# Patient Record
Sex: Male | Born: 1980
Health system: Southern US, Community
[De-identification: ages and names within clinical notes are randomized; demographics above are authoritative.]

## PROBLEM LIST (undated history)

## (undated) DIAGNOSIS — K219 Gastro-esophageal reflux disease without esophagitis: Secondary | ICD-10-CM

## (undated) DIAGNOSIS — J45909 Unspecified asthma, uncomplicated: Secondary | ICD-10-CM

## (undated) DIAGNOSIS — N200 Calculus of kidney: Secondary | ICD-10-CM

## (undated) HISTORY — PX: WISDOM TOOTH EXTRACTION: SHX21

---

## 2004-12-29 ENCOUNTER — Emergency Department (HOSPITAL_COMMUNITY): Admission: EM | Admit: 2004-12-29 | Discharge: 2004-12-29 | Payer: Self-pay | Admitting: Emergency Medicine

## 2005-11-10 ENCOUNTER — Emergency Department (HOSPITAL_COMMUNITY): Admission: EM | Admit: 2005-11-10 | Discharge: 2005-11-10 | Payer: Self-pay | Admitting: Emergency Medicine

## 2006-06-27 ENCOUNTER — Emergency Department (HOSPITAL_COMMUNITY): Admission: EM | Admit: 2006-06-27 | Discharge: 2006-06-27 | Payer: Self-pay | Admitting: Emergency Medicine

## 2006-11-03 ENCOUNTER — Ambulatory Visit: Payer: Self-pay | Admitting: *Deleted

## 2006-11-03 ENCOUNTER — Ambulatory Visit: Payer: Self-pay | Admitting: Internal Medicine

## 2006-11-07 ENCOUNTER — Emergency Department (HOSPITAL_COMMUNITY): Admission: EM | Admit: 2006-11-07 | Discharge: 2006-11-07 | Payer: Self-pay | Admitting: Family Medicine

## 2006-11-08 ENCOUNTER — Emergency Department (HOSPITAL_COMMUNITY): Admission: EM | Admit: 2006-11-08 | Discharge: 2006-11-08 | Payer: Self-pay | Admitting: Emergency Medicine

## 2006-11-09 ENCOUNTER — Ambulatory Visit (HOSPITAL_COMMUNITY): Admission: RE | Admit: 2006-11-09 | Discharge: 2006-11-09 | Payer: Self-pay | Admitting: Family Medicine

## 2006-11-11 ENCOUNTER — Ambulatory Visit: Payer: Self-pay | Admitting: Internal Medicine

## 2006-11-27 ENCOUNTER — Emergency Department (HOSPITAL_COMMUNITY): Admission: EM | Admit: 2006-11-27 | Discharge: 2006-11-27 | Payer: Self-pay | Admitting: Emergency Medicine

## 2007-01-08 ENCOUNTER — Emergency Department (HOSPITAL_COMMUNITY): Admission: EM | Admit: 2007-01-08 | Discharge: 2007-01-08 | Payer: Self-pay | Admitting: Family Medicine

## 2007-02-07 ENCOUNTER — Encounter (INDEPENDENT_AMBULATORY_CARE_PROVIDER_SITE_OTHER): Payer: Self-pay | Admitting: Nurse Practitioner

## 2007-02-07 ENCOUNTER — Ambulatory Visit: Payer: Self-pay | Admitting: Internal Medicine

## 2007-02-07 LAB — CONVERTED CEMR LAB
ALT: 12 units/L (ref 0–53)
AST: 16 units/L (ref 0–37)
Albumin: 4.7 g/dL (ref 3.5–5.2)
Alkaline Phosphatase: 44 units/L (ref 39–117)
BUN: 12 mg/dL (ref 6–23)
Basophils Absolute: 0 10*3/uL (ref 0.0–0.1)
Basophils Relative: 0 % (ref 0–1)
CO2: 24 meq/L (ref 19–32)
Calcium: 9.5 mg/dL (ref 8.4–10.5)
Chloride: 108 meq/L (ref 96–112)
Creatinine, Ser: 0.85 mg/dL (ref 0.40–1.50)
Eosinophils Absolute: 0 10*3/uL (ref 0.0–0.7)
Eosinophils Relative: 0 % (ref 0–5)
Glucose, Bld: 96 mg/dL (ref 70–99)
HCT: 40.2 % (ref 39.0–52.0)
Hemoglobin: 13.6 g/dL (ref 13.0–17.0)
Lymphocytes Relative: 34 % (ref 12–46)
Lymphs Abs: 1.7 10*3/uL (ref 0.7–4.0)
MCHC: 33.8 g/dL (ref 30.0–36.0)
MCV: 95.3 fL (ref 78.0–100.0)
Monocytes Absolute: 0.6 10*3/uL (ref 0.1–1.0)
Monocytes Relative: 12 % (ref 3–12)
Neutro Abs: 2.6 10*3/uL (ref 1.7–7.7)
Neutrophils Relative %: 54 % (ref 43–77)
Platelets: 257 10*3/uL (ref 150–400)
Potassium: 4.2 meq/L (ref 3.5–5.3)
RBC: 4.22 M/uL (ref 4.22–5.81)
RDW: 12.9 % (ref 11.5–15.5)
Sodium: 142 meq/L (ref 135–145)
Total Bilirubin: 0.7 mg/dL (ref 0.3–1.2)
Total Protein: 7.1 g/dL (ref 6.0–8.3)
WBC: 4.9 10*3/uL (ref 4.0–10.5)

## 2007-05-15 ENCOUNTER — Emergency Department (HOSPITAL_COMMUNITY): Admission: EM | Admit: 2007-05-15 | Discharge: 2007-05-15 | Payer: Self-pay | Admitting: Emergency Medicine

## 2007-05-15 ENCOUNTER — Ambulatory Visit: Payer: Self-pay | Admitting: Internal Medicine

## 2007-06-08 ENCOUNTER — Ambulatory Visit: Payer: Self-pay | Admitting: Internal Medicine

## 2008-02-03 ENCOUNTER — Emergency Department (HOSPITAL_COMMUNITY): Admission: EM | Admit: 2008-02-03 | Discharge: 2008-02-03 | Payer: Self-pay | Admitting: Family Medicine

## 2008-07-17 ENCOUNTER — Emergency Department (HOSPITAL_COMMUNITY): Admission: EM | Admit: 2008-07-17 | Discharge: 2008-07-17 | Payer: Self-pay | Admitting: Family Medicine

## 2010-02-26 IMAGING — CR DG HAND COMPLETE 3+V*L*
3 series · 3 of 3 positions shown · non-contrast
Comparison: None

CLINICAL DATA: Hand trauma.  Pain and swelling.

LEFT HAND - COMPLETE 3+ VIEW

[view not recorded (1 of 3)]
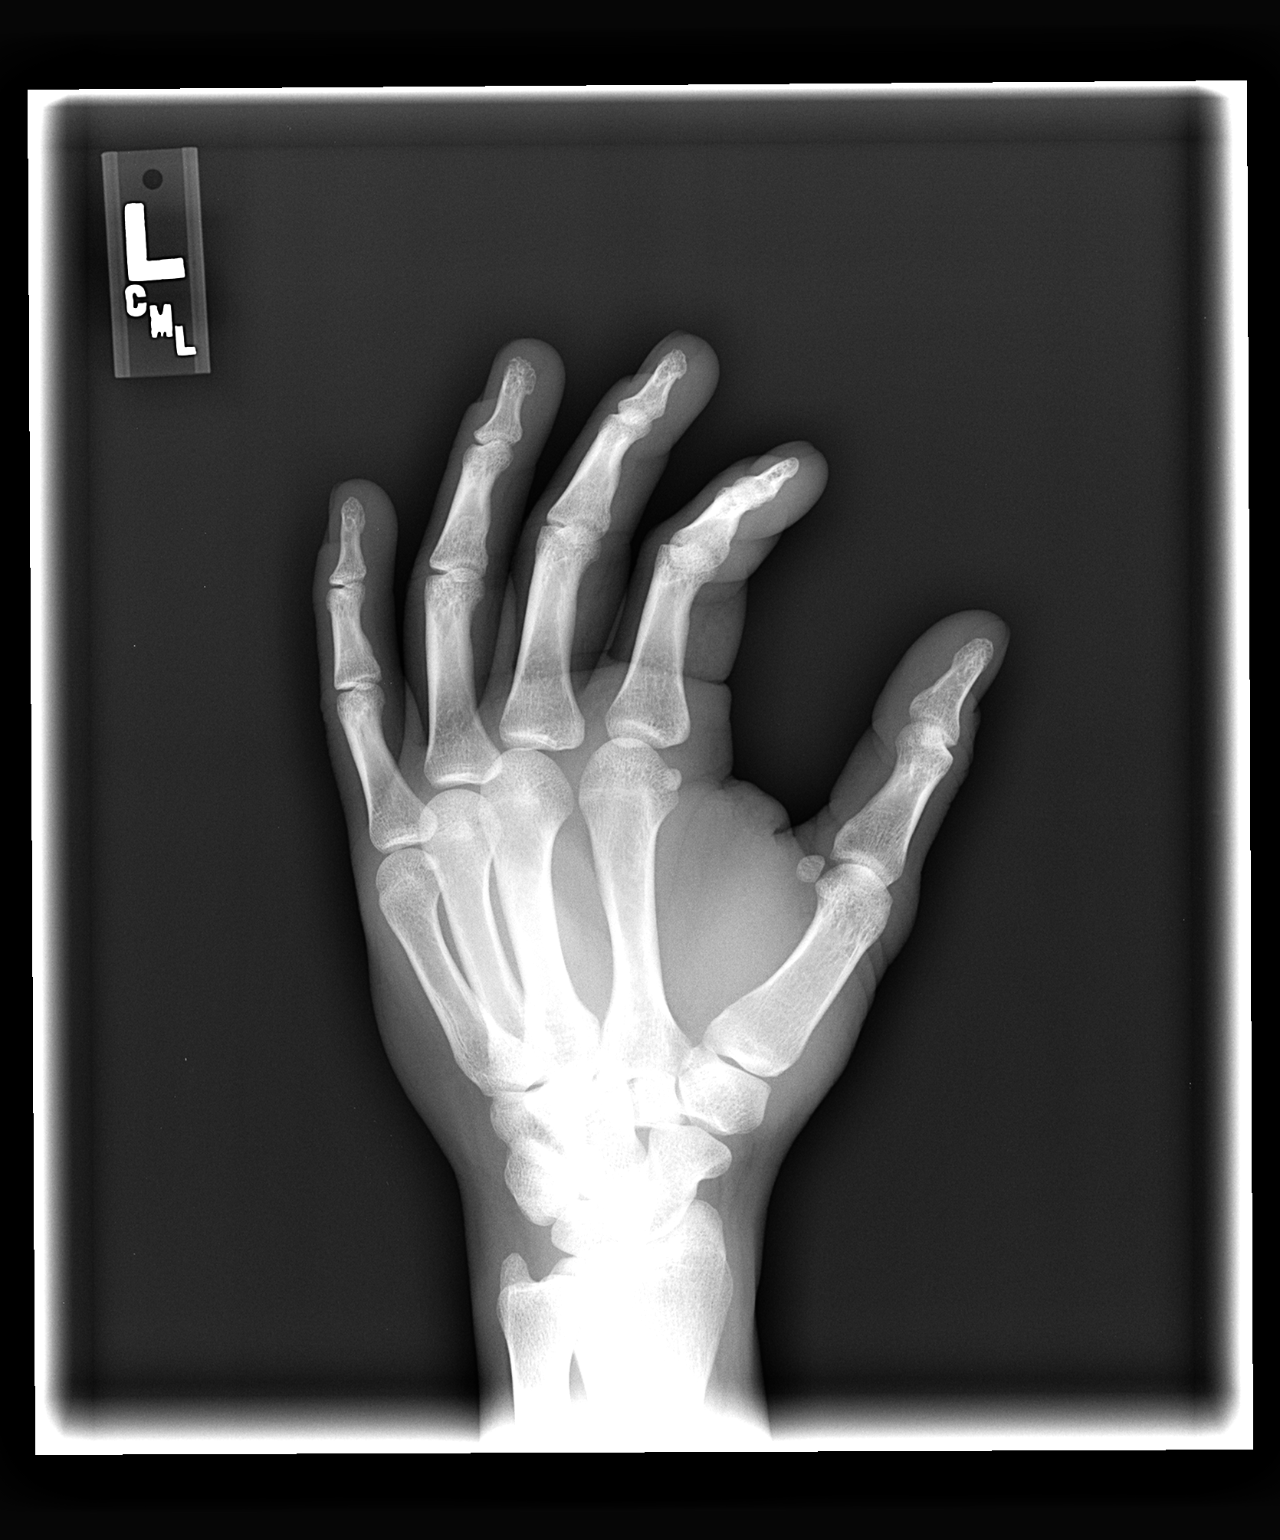

[view not recorded (2 of 3)]
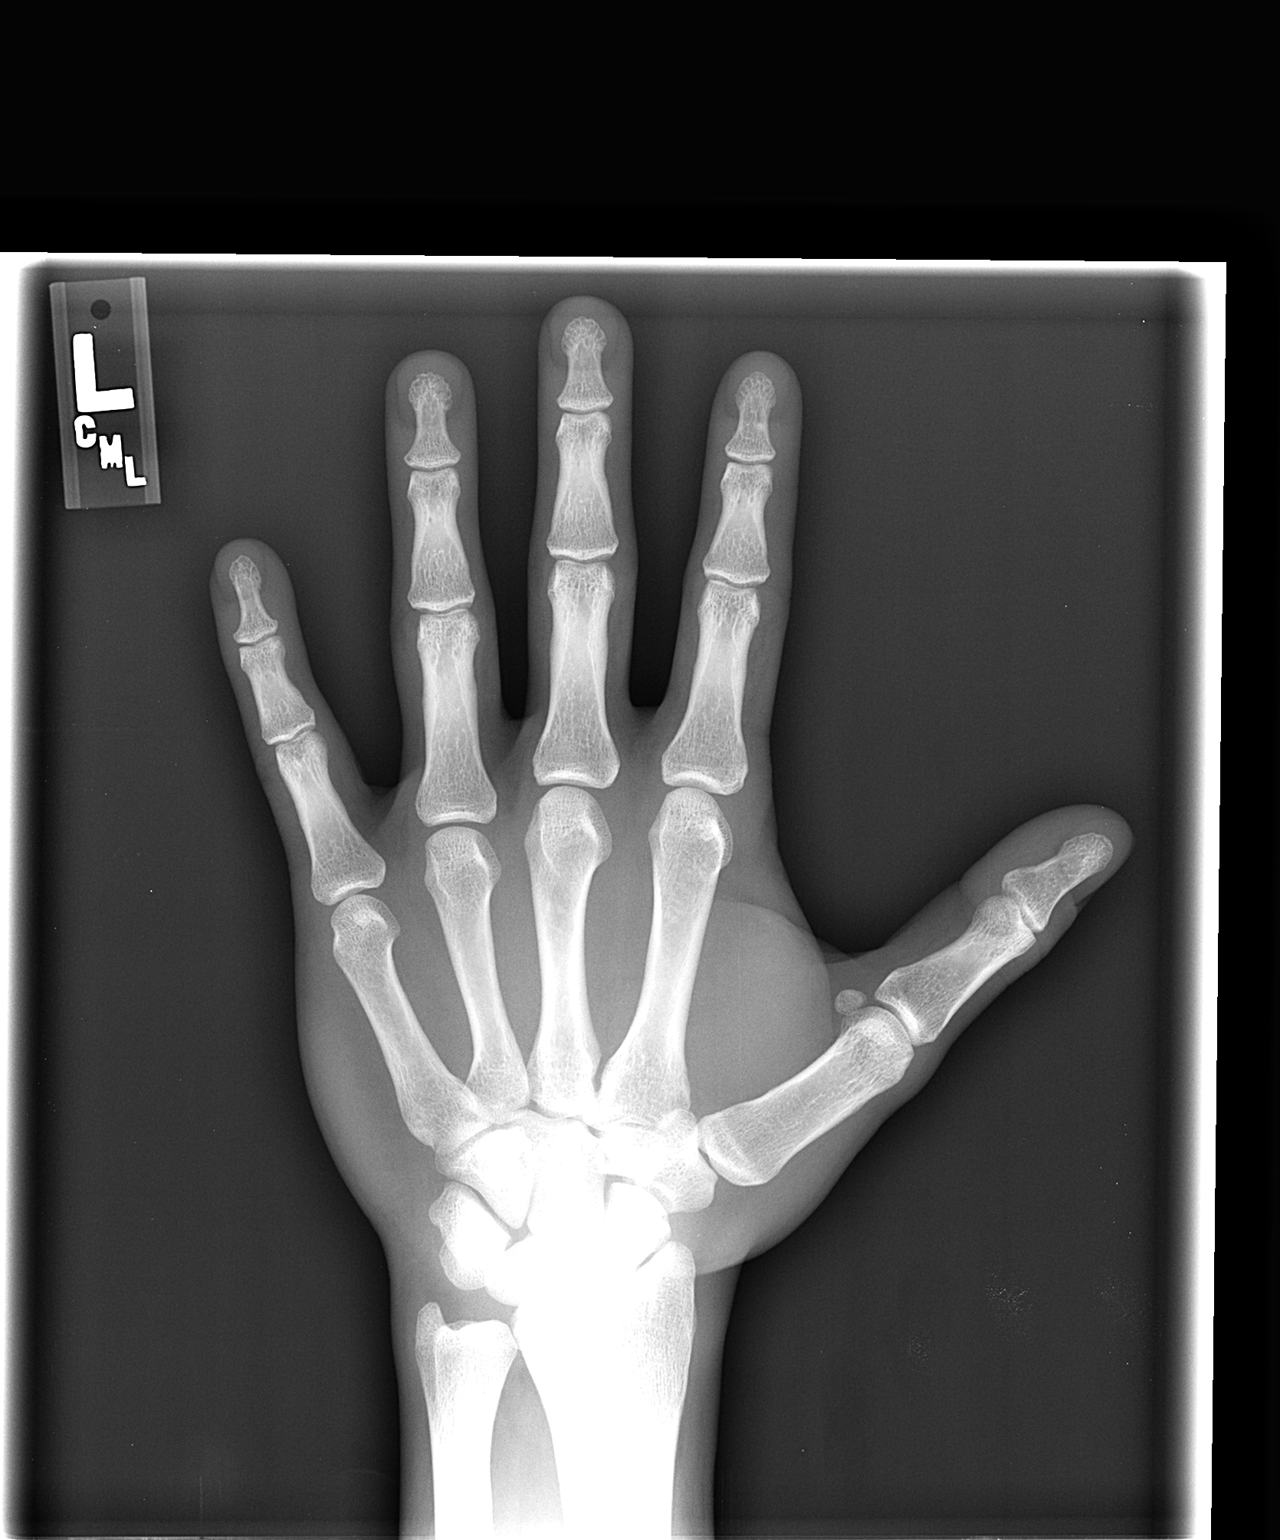

[view not recorded (3 of 3)]
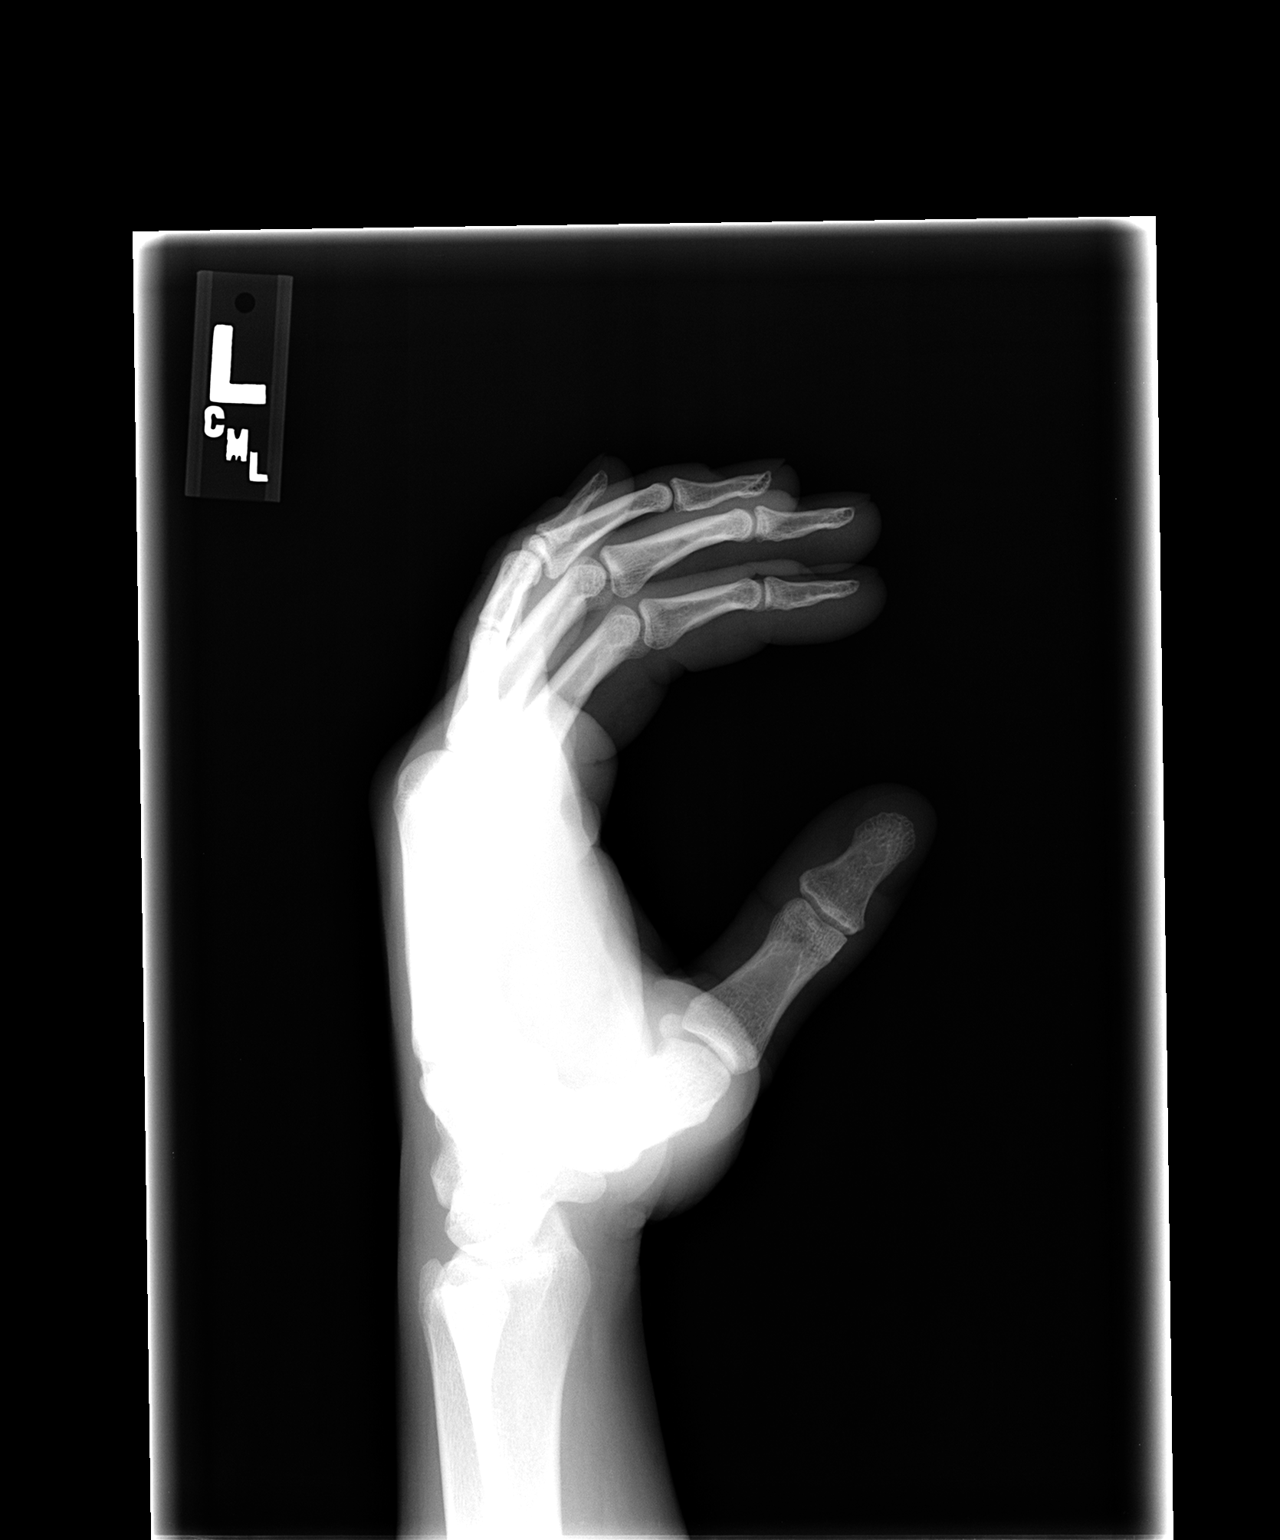

[3 of 3 positions shown; findings below may reference images not displayed]

FINDINGS: There is no evidence of fracture or dislocation.  There
is no evidence of arthropathy or other focal bone abnormality.
Soft tissues are unremarkable.
IMPRESSION: Negative.

## 2010-11-19 LAB — CULTURE, ROUTINE-ABSCESS

## 2014-11-12 ENCOUNTER — Emergency Department (HOSPITAL_COMMUNITY): Payer: Self-pay

## 2014-11-12 ENCOUNTER — Inpatient Hospital Stay (HOSPITAL_COMMUNITY)
Admission: EM | Admit: 2014-11-12 | Discharge: 2014-11-14 | DRG: 563 | Disposition: A | Discharge status: Home / Self Care | Payer: Self-pay | Attending: Orthopedic Surgery | Admitting: Orthopedic Surgery

## 2014-11-12 ENCOUNTER — Encounter (HOSPITAL_COMMUNITY): Payer: Self-pay | Admitting: Emergency Medicine

## 2014-11-12 DIAGNOSIS — K219 Gastro-esophageal reflux disease without esophagitis: Secondary | ICD-10-CM | POA: Diagnosis present

## 2014-11-12 DIAGNOSIS — J45909 Unspecified asthma, uncomplicated: Secondary | ICD-10-CM | POA: Diagnosis present

## 2014-11-12 DIAGNOSIS — S82841B Displaced bimalleolar fracture of right lower leg, initial encounter for open fracture type I or II: Principal | ICD-10-CM | POA: Diagnosis present

## 2014-11-12 DIAGNOSIS — S82899B Other fracture of unspecified lower leg, initial encounter for open fracture type I or II: Secondary | ICD-10-CM | POA: Diagnosis present

## 2014-11-12 DIAGNOSIS — S82891C Other fracture of right lower leg, initial encounter for open fracture type IIIA, IIIB, or IIIC: Secondary | ICD-10-CM

## 2014-11-12 DIAGNOSIS — Z91048 Other nonmedicinal substance allergy status: Secondary | ICD-10-CM

## 2014-11-12 HISTORY — DX: Unspecified asthma, uncomplicated: J45.909

## 2014-11-12 LAB — CBC WITH DIFFERENTIAL/PLATELET
Basophils Absolute: 0 10*3/uL (ref 0.0–0.1)
Basophils Relative: 1 %
EOS ABS: 0.1 10*3/uL (ref 0.0–0.7)
EOS PCT: 2 %
HCT: 37.9 % — ABNORMAL LOW (ref 39.0–52.0)
Hemoglobin: 12.5 g/dL — ABNORMAL LOW (ref 13.0–17.0)
LYMPHS ABS: 2.9 10*3/uL (ref 0.7–4.0)
LYMPHS PCT: 49 %
MCH: 31.4 pg (ref 26.0–34.0)
MCHC: 33 g/dL (ref 30.0–36.0)
MCV: 95.2 fL (ref 78.0–100.0)
MONO ABS: 0.7 10*3/uL (ref 0.1–1.0)
Monocytes Relative: 11 %
Neutro Abs: 2.1 10*3/uL (ref 1.7–7.7)
Neutrophils Relative %: 37 %
PLATELETS: 233 10*3/uL (ref 150–400)
RBC: 3.98 MIL/uL — ABNORMAL LOW (ref 4.22–5.81)
RDW: 12.2 % (ref 11.5–15.5)
WBC: 5.8 10*3/uL (ref 4.0–10.5)

## 2014-11-12 LAB — BASIC METABOLIC PANEL
Anion gap: 7 (ref 5–15)
BUN: 12 mg/dL (ref 6–20)
CALCIUM: 8.9 mg/dL (ref 8.9–10.3)
CO2: 27 mmol/L (ref 22–32)
CREATININE: 0.81 mg/dL (ref 0.61–1.24)
Chloride: 105 mmol/L (ref 101–111)
GFR calc Af Amer: >60 mL/min (ref >60)
GLUCOSE: 95 mg/dL (ref 65–99)
Potassium: 3.4 mmol/L — ABNORMAL LOW (ref 3.5–5.1)
SODIUM: 139 mmol/L (ref 135–145)

## 2014-11-12 MED ORDER — TETANUS-DIPHTH-ACELL PERTUSSIS 5-2.5-18.5 LF-MCG/0.5 IM SUSP
0.5000 mL | Freq: Once | INTRAMUSCULAR | Status: AC
  Administered 2014-11-12: 0.5 mL via INTRAMUSCULAR

## 2014-11-12 MED ORDER — CEFAZOLIN SODIUM 1-5 GM-% IV SOLN: 1.0000 g | Freq: Once | INTRAVENOUS | Status: DC

## 2014-11-12 MED ORDER — HYDROMORPHONE HCL 1 MG/ML IJ SOLN: 1.0000 mg | INTRAMUSCULAR | Status: DC | PRN

## 2014-11-12 MED ORDER — CEFAZOLIN SODIUM-DEXTROSE 2-3 GM-% IV SOLR
INTRAVENOUS | Status: AC
  Administered 2014-11-12: 2000 mg
  Filled 2014-11-12: qty 50

## 2014-11-12 MED ORDER — HYDROMORPHONE HCL 1 MG/ML IJ SOLN
INTRAMUSCULAR | Status: AC
  Administered 2014-11-12: 1 mg
  Filled 2014-11-12: qty 1

## 2014-11-12 MED ORDER — LIDOCAINE-EPINEPHRINE 2 %-1:200000 IJ SOLN
20.0000 mL | Freq: Once | INTRAMUSCULAR | Status: AC
Start: 2014-11-12 — End: 2014-11-12
  Administered 2014-11-12: 20 mL via INTRADERMAL

## 2014-11-12 MED ORDER — TETANUS-DIPHTH-ACELL PERTUSSIS 5-2.5-18.5 LF-MCG/0.5 IM SUSP
INTRAMUSCULAR | Status: AC
  Filled 2014-11-12: qty 0.5

## 2014-11-12 MED ORDER — LIDOCAINE-EPINEPHRINE (PF) 2 %-1:200000 IJ SOLN
INTRAMUSCULAR | Status: AC
  Filled 2014-11-12: qty 20

## 2014-11-12 NOTE — ED Notes (Signed)
Awaiting Dr. Shon Baton surgeon to see the patient.
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
## 2014-11-12 NOTE — ED Notes (Signed)
Dr. Clydene Pugh at the bedsde,
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
## 2014-11-12 NOTE — Progress Notes (Signed)
Orthopedic Tech Progress Note Patient Details:  John Lozano [facility] 23-Feb-1980 [id number]  Ortho Devices Type of Ortho Device: Ace wrap, Post (short leg) splint, Stirrup splint Ortho Device/Splint Location: RLE Ortho Device/Splint Interventions: Ordered, Application   Jennye Moccasin 11/12/2014, 11:05 PM

## 2014-11-13 ENCOUNTER — Emergency Department (HOSPITAL_COMMUNITY): Payer: No Typology Code available for payment source | Admitting: Anesthesiology

## 2014-11-13 ENCOUNTER — Inpatient Hospital Stay (HOSPITAL_COMMUNITY): Payer: Self-pay

## 2014-11-13 ENCOUNTER — Encounter (HOSPITAL_COMMUNITY)
Admission: EM | Disposition: A | Discharge status: Home / Self Care | Payer: Self-pay | Source: Home / Self Care | Attending: Orthopedic Surgery

## 2014-11-13 ENCOUNTER — Emergency Department (HOSPITAL_COMMUNITY): Payer: Self-pay | Admitting: Anesthesiology

## 2014-11-13 DIAGNOSIS — S82899B Other fracture of unspecified lower leg, initial encounter for open fracture type I or II: Secondary | ICD-10-CM | POA: Diagnosis present

## 2014-11-13 HISTORY — PX: I & D EXTREMITY: SHX5045

## 2014-11-13 HISTORY — DX: Other fracture of unspecified lower leg, initial encounter for open fracture type I or II: S82.899B

## 2014-11-13 SURGERY — MINOR IRRIGATION AND DEBRIDEMENT EXTREMITY
Anesthesia: General | Site: Ankle | Laterality: Right

## 2014-11-13 MED ORDER — METOCLOPRAMIDE HCL 5 MG PO TABS: 5.0000 mg | ORAL_TABLET | Freq: Three times a day (TID) | ORAL | Status: DC | PRN

## 2014-11-13 MED ORDER — 0.9 % SODIUM CHLORIDE (POUR BTL) OPTIME
TOPICAL | Status: DC | PRN
  Administered 2014-11-13: 1000 mL

## 2014-11-13 MED ORDER — CEFAZOLIN SODIUM-DEXTROSE 2-3 GM-% IV SOLR
INTRAVENOUS | Status: DC | PRN
  Administered 2014-11-13: 2 g via INTRAVENOUS

## 2014-11-13 MED ORDER — LACTATED RINGERS IV SOLN
INTRAVENOUS | Status: DC | PRN
  Administered 2014-11-13: 01:00:00 via INTRAVENOUS

## 2014-11-13 MED ORDER — MIDAZOLAM HCL 2 MG/2ML IJ SOLN
INTRAMUSCULAR | Status: DC | PRN
  Administered 2014-11-13: 2 mg via INTRAVENOUS

## 2014-11-13 MED ORDER — CEFAZOLIN SODIUM 1-5 GM-% IV SOLN
INTRAVENOUS | Status: AC
  Filled 2014-11-13: qty 50

## 2014-11-13 MED ORDER — HYDROMORPHONE HCL 1 MG/ML IJ SOLN
0.2500 mg | INTRAMUSCULAR | Status: DC | PRN
  Administered 2014-11-13 (×4): 0.5 mg via INTRAVENOUS

## 2014-11-13 MED ORDER — MIDAZOLAM HCL 2 MG/2ML IJ SOLN
0.5000 mg | Freq: Once | INTRAMUSCULAR | Status: AC | PRN
  Administered 2014-11-13: 2 mg via INTRAVENOUS

## 2014-11-13 MED ORDER — MIDAZOLAM HCL 2 MG/2ML IJ SOLN
INTRAMUSCULAR | Status: AC
  Filled 2014-11-13: qty 2

## 2014-11-13 MED ORDER — SUCCINYLCHOLINE CHLORIDE 20 MG/ML IJ SOLN
INTRAMUSCULAR | Status: DC | PRN
  Administered 2014-11-13: 100 mg via INTRAVENOUS

## 2014-11-13 MED ORDER — OXYCODONE-ACETAMINOPHEN 5-325 MG PO TABS
1.0000 | ORAL_TABLET | ORAL | Status: DC | PRN
  Administered 2014-11-13 – 2014-11-14 (×6): 2 via ORAL
  Filled 2014-11-13 (×6): qty 2

## 2014-11-13 MED ORDER — KETOROLAC TROMETHAMINE 15 MG/ML IJ SOLN
15.0000 mg | Freq: Four times a day (QID) | INTRAMUSCULAR | Status: AC
  Administered 2014-11-13 (×4): 15 mg via INTRAVENOUS
  Filled 2014-11-13 (×3): qty 1

## 2014-11-13 MED ORDER — HYDROMORPHONE HCL 1 MG/ML IJ SOLN
INTRAMUSCULAR | Status: AC
  Filled 2014-11-13: qty 1

## 2014-11-13 MED ORDER — ACETAMINOPHEN 325 MG PO TABS: 650.0000 mg | ORAL_TABLET | Freq: Four times a day (QID) | ORAL | Status: DC | PRN

## 2014-11-13 MED ORDER — KETOROLAC TROMETHAMINE 15 MG/ML IJ SOLN
INTRAMUSCULAR | Status: AC
  Filled 2014-11-13: qty 1

## 2014-11-13 MED ORDER — SUCCINYLCHOLINE CHLORIDE 20 MG/ML IJ SOLN
INTRAMUSCULAR | Status: AC
  Filled 2014-11-13: qty 1

## 2014-11-13 MED ORDER — PROPOFOL 10 MG/ML IV BOLUS
INTRAVENOUS | Status: DC | PRN
  Administered 2014-11-13: 200 mg via INTRAVENOUS

## 2014-11-13 MED ORDER — LACTATED RINGERS IV SOLN
INTRAVENOUS | Status: DC
  Administered 2014-11-13: 05:00:00 via INTRAVENOUS

## 2014-11-13 MED ORDER — FENTANYL CITRATE (PF) 250 MCG/5ML IJ SOLN
INTRAMUSCULAR | Status: AC
  Filled 2014-11-13: qty 5

## 2014-11-13 MED ORDER — FENTANYL CITRATE (PF) 250 MCG/5ML IJ SOLN
INTRAMUSCULAR | Status: DC | PRN
  Administered 2014-11-13 (×4): 50 ug via INTRAVENOUS

## 2014-11-13 MED ORDER — CEFAZOLIN SODIUM 1-5 GM-% IV SOLN: 1.0000 g | Freq: Once | INTRAVENOUS | Status: DC

## 2014-11-13 MED ORDER — LIDOCAINE HCL (CARDIAC) 20 MG/ML IV SOLN
INTRAVENOUS | Status: DC | PRN
  Administered 2014-11-13: 60 mg via INTRAVENOUS

## 2014-11-13 MED ORDER — PROPOFOL 10 MG/ML IV BOLUS
INTRAVENOUS | Status: AC
  Filled 2014-11-13: qty 20

## 2014-11-13 MED ORDER — METHOCARBAMOL 1000 MG/10ML IJ SOLN
500.0000 mg | Freq: Four times a day (QID) | INTRAVENOUS | Status: DC | PRN
  Filled 2014-11-13: qty 5

## 2014-11-13 MED ORDER — METOCLOPRAMIDE HCL 5 MG/ML IJ SOLN: 5.0000 mg | Freq: Three times a day (TID) | INTRAMUSCULAR | Status: DC | PRN

## 2014-11-13 MED ORDER — ONDANSETRON HCL 4 MG/2ML IJ SOLN
INTRAMUSCULAR | Status: DC | PRN
  Administered 2014-11-13: 4 mg via INTRAVENOUS

## 2014-11-13 MED ORDER — ONDANSETRON HCL 4 MG/2ML IJ SOLN: 4.0000 mg | Freq: Four times a day (QID) | INTRAMUSCULAR | Status: DC | PRN

## 2014-11-13 MED ORDER — OXYCODONE HCL 5 MG PO TABS: 5.0000 mg | ORAL_TABLET | ORAL | Status: DC | PRN

## 2014-11-13 MED ORDER — ACETAMINOPHEN 650 MG RE SUPP: 650.0000 mg | Freq: Four times a day (QID) | RECTAL | Status: DC | PRN

## 2014-11-13 MED ORDER — LIDOCAINE HCL (CARDIAC) 20 MG/ML IV SOLN
INTRAVENOUS | Status: AC
  Filled 2014-11-13: qty 10

## 2014-11-13 MED ORDER — ONDANSETRON HCL 4 MG PO TABS: 4.0000 mg | ORAL_TABLET | Freq: Four times a day (QID) | ORAL | Status: DC | PRN

## 2014-11-13 MED ORDER — METHOCARBAMOL 500 MG PO TABS
ORAL_TABLET | ORAL | Status: AC
Start: 2014-11-13 — End: 2014-11-13
  Filled 2014-11-13: qty 1

## 2014-11-13 MED ORDER — MORPHINE SULFATE (PF) 2 MG/ML IV SOLN
2.0000 mg | INTRAVENOUS | Status: DC | PRN
  Administered 2014-11-13: 2 mg via INTRAVENOUS
  Filled 2014-11-13: qty 1

## 2014-11-13 MED ORDER — METHOCARBAMOL 500 MG PO TABS
500.0000 mg | ORAL_TABLET | Freq: Four times a day (QID) | ORAL | Status: DC | PRN
Start: 2014-11-13 — End: 2014-11-14
  Administered 2014-11-13 – 2014-11-14 (×6): 500 mg via ORAL
  Filled 2014-11-13 (×7): qty 1

## 2014-11-13 MED ORDER — CEFAZOLIN SODIUM 1-5 GM-% IV SOLN
1.0000 g | Freq: Four times a day (QID) | INTRAVENOUS | Status: AC
  Administered 2014-11-13 (×3): 1 g via INTRAVENOUS
  Filled 2014-11-13 (×4): qty 50

## 2014-11-13 MED ORDER — MIDAZOLAM HCL 2 MG/2ML IJ SOLN
INTRAMUSCULAR | Status: AC
  Filled 2014-11-13: qty 4

## 2014-11-13 SURGICAL SUPPLY — 75 items
BANDAGE ELASTIC 4 VELCRO ST LF (GAUZE/BANDAGES/DRESSINGS) ×1 IMPLANT
BANDAGE ELASTIC 6 VELCRO ST LF (GAUZE/BANDAGES/DRESSINGS) ×1 IMPLANT
BNDG GAUZE ELAST 4 BULKY (GAUZE/BANDAGES/DRESSINGS) ×1 IMPLANT
BUR EGG ELITE 4.0 (BURR) IMPLANT
BUR MATCHSTICK NEURO 3.0 LAGG (BURR) ×1 IMPLANT
CANISTER SUCTION 2500CC (MISCELLANEOUS) ×2 IMPLANT
CLSR STERI-STRIP ANTIMIC 1/2X4 (GAUZE/BANDAGES/DRESSINGS) ×2 IMPLANT
CORDS BIPOLAR (ELECTRODE) ×2 IMPLANT
COVER SURGICAL LIGHT HANDLE (MISCELLANEOUS) ×2 IMPLANT
DRAIN CHANNEL 15F RND FF W/TCR (WOUND CARE) ×1 IMPLANT
DRAPE INCISE IOBAN 66X45 STRL (DRAPES) ×1 IMPLANT
DRAPE POUCH INSTRU U-SHP 10X18 (DRAPES) ×2 IMPLANT
DRAPE SURG 17X23 STRL (DRAPES) ×6 IMPLANT
DRAPE U-SHAPE 47X51 STRL (DRAPES) ×1 IMPLANT
DRSG MEPILEX BORDER 4X8 (GAUZE/BANDAGES/DRESSINGS) ×2 IMPLANT
DRSG PAD ABDOMINAL 8X10 ST (GAUZE/BANDAGES/DRESSINGS) ×2 IMPLANT
DURAPREP 26ML APPLICATOR (WOUND CARE) ×1 IMPLANT
ELECT BLADE 4.0 EZ CLEAN MEGAD (MISCELLANEOUS)
ELECT CAUTERY BLADE 6.4 (BLADE) ×2 IMPLANT
ELECT PENCIL ROCKER SW 15FT (MISCELLANEOUS) ×1 IMPLANT
ELECT REM PT RETURN 9FT ADLT (ELECTROSURGICAL) ×2
ELECTRODE BLDE 4.0 EZ CLN MEGD (MISCELLANEOUS) IMPLANT
ELECTRODE REM PT RTRN 9FT ADLT (ELECTROSURGICAL) ×1 IMPLANT
EVACUATOR 1/8 PVC DRAIN (DRAIN) IMPLANT
EVACUATOR SILICONE 100CC (DRAIN) ×1 IMPLANT
GAUZE SPONGE 4X4 12PLY STRL (GAUZE/BANDAGES/DRESSINGS) ×1 IMPLANT
GLOVE BIOGEL PI IND STRL 8 (GLOVE) ×1 IMPLANT
GLOVE BIOGEL PI IND STRL 8.5 (GLOVE) ×1 IMPLANT
GLOVE BIOGEL PI INDICATOR 8 (GLOVE) ×1
GLOVE BIOGEL PI INDICATOR 8.5 (GLOVE) ×1
GLOVE ORTHO TXT STRL SZ7.5 (GLOVE) ×2 IMPLANT
GLOVE SS BIOGEL STRL SZ 8.5 (GLOVE) ×1 IMPLANT
GLOVE SUPERSENSE BIOGEL SZ 8.5 (GLOVE) ×1
GOWN STRL REUS W/ TWL XL LVL3 (GOWN DISPOSABLE) ×2 IMPLANT
GOWN STRL REUS W/TWL 2XL LVL3 (GOWN DISPOSABLE) ×3 IMPLANT
GOWN STRL REUS W/TWL XL LVL3 (GOWN DISPOSABLE) ×2
HANDPIECE INTERPULSE COAX TIP (DISPOSABLE) ×2
KIT BASIN OR (CUSTOM PROCEDURE TRAY) ×2 IMPLANT
KIT PREVENA INCISION MGT 13 (CANNISTER) ×1 IMPLANT
KIT ROOM TURNOVER OR (KITS) ×2 IMPLANT
MARKER SKIN DUAL TIP RULER LAB (MISCELLANEOUS) ×1 IMPLANT
NDL SPNL 18GX3.5 QUINCKE PK (NEEDLE) ×2 IMPLANT
NEEDLE 22X1 1/2 (OR ONLY) (NEEDLE) ×2 IMPLANT
NEEDLE SPNL 18GX3.5 QUINCKE PK (NEEDLE) ×4 IMPLANT
NS IRRIG 1000ML POUR BTL (IV SOLUTION) ×2 IMPLANT
PACK LAMINECTOMY ORTHO (CUSTOM PROCEDURE TRAY) ×2 IMPLANT
PACK UNIVERSAL I (CUSTOM PROCEDURE TRAY) ×2 IMPLANT
PAD ARMBOARD 7.5X6 YLW CONV (MISCELLANEOUS) ×4 IMPLANT
PAD CAST 4YDX4 CTTN HI CHSV (CAST SUPPLIES) IMPLANT
PADDING CAST COTTON 4X4 STRL (CAST SUPPLIES) ×2
PADDING CAST COTTON 6X4 STRL (CAST SUPPLIES) ×1 IMPLANT
PATTIES SURGICAL .5 X.5 (GAUZE/BANDAGES/DRESSINGS) IMPLANT
PATTIES SURGICAL .5 X1 (DISPOSABLE) ×2 IMPLANT
SET HNDPC FAN SPRY TIP SCT (DISPOSABLE) IMPLANT
SPLINT PLASTER CAST XFAST 5X30 (CAST SUPPLIES) IMPLANT
SPLINT PLASTER XFAST SET 5X30 (CAST SUPPLIES) ×1
SPONGE SURGIFOAM ABS GEL 100 (HEMOSTASIS) ×1 IMPLANT
SURGIFLO W/THROMBIN 8M KIT (HEMOSTASIS) IMPLANT
SUT BONE WAX W31G (SUTURE) ×1 IMPLANT
SUT ETHILON 2 0 FS 18 (SUTURE) ×3 IMPLANT
SUT MON AB 3-0 SH 27 (SUTURE)
SUT MON AB 3-0 SH27 (SUTURE) ×1 IMPLANT
SUT VIC AB 0 CT1 27 (SUTURE)
SUT VIC AB 0 CT1 27XBRD ANBCTR (SUTURE) ×1 IMPLANT
SUT VIC AB 1 CT1 27 (SUTURE)
SUT VIC AB 1 CT1 27XBRD ANBCTR (SUTURE) ×1 IMPLANT
SUT VIC AB 1 CTX 36 (SUTURE)
SUT VIC AB 1 CTX36XBRD ANBCTR (SUTURE) ×2 IMPLANT
SUT VIC AB 2-0 CT1 18 (SUTURE) ×1 IMPLANT
SYR BULB IRRIGATION 50ML (SYRINGE) ×2 IMPLANT
SYR CONTROL 10ML LL (SYRINGE) ×1 IMPLANT
TOWEL OR 17X24 6PK STRL BLUE (TOWEL DISPOSABLE) ×2 IMPLANT
TOWEL OR 17X26 10 PK STRL BLUE (TOWEL DISPOSABLE) ×2 IMPLANT
WATER STERILE IRR 1000ML POUR (IV SOLUTION) ×2 IMPLANT
YANKAUER SUCT BULB TIP NO VENT (SUCTIONS) ×2 IMPLANT

## 2014-11-13 NOTE — ED Notes (Signed)
Patient alert and oriented, states Dr. Shon Baton has spoken to him.

## 2014-11-13 NOTE — Brief Op Note (Signed)
11/12/2014 - 11/13/2014  2:01 AM  PATIENT:  John Lozano  34 y.o. male  PRE-OPERATIVE DIAGNOSIS:  open ankle fracture  POST-OPERATIVE DIAGNOSIS:  open ankle fracture  PROCEDURE:  Procedure(s):  IRRIGATION AND DEBRIDEMENT RIGHT ANKLE, complex wound closure, placement of Vac, and splinting of right ankle  (Right)  SURGEON:  Surgeon(s) and Role:    * Venita Lick, MD - Primary  PHYSICIAN ASSISTANT:   ASSISTANTS: none   ANESTHESIA:   general  EBL:     BLOOD ADMINISTERED:none  DRAINS: incisional wound VAC   LOCAL MEDICATIONS USED:  NONE  SPECIMEN:  No Specimen  DISPOSITION OF SPECIMEN:  N/A  COUNTS:  YES  TOURNIQUET:    DICTATION: .Other Dictation: Dictation Number [id number]  PLAN OF CARE: Admit to inpatient   PATIENT DISPOSITION:  PACU - hemodynamically stable.
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
## 2014-11-13 NOTE — Anesthesia Preprocedure Evaluation (Addendum)
Anesthesia Evaluation  Patient identified by MRN, date of birth, ID band Patient awake    Reviewed: Allergy & Precautions, NPO status , Patient's Chart, lab work & pertinent test results  Airway Mallampati: I  TM Distance: >3 FB Neck ROM: Full    Dental  (+) Dental Advisory Given,    Pulmonary asthma ,  Only flares up when has allergies   breath sounds clear to auscultation       Cardiovascular  Rhythm:Regular Rate:Normal     Neuro/Psych    GI/Hepatic   Endo/Other    Renal/GU      Musculoskeletal   Abdominal   Peds  Hematology   Anesthesia Other Findings   Reproductive/Obstetrics                             Anesthesia Physical Anesthesia Plan  ASA: I and emergent  Anesthesia Plan: General   Post-op Pain Management:    Induction: Intravenous, Rapid sequence and Cricoid pressure planned  Airway Management Planned: Oral ETT  Additional Equipment:   Intra-op Plan:   Post-operative Plan: Extubation in OR  Informed Consent: I have reviewed the patients History and Physical, chart, labs and discussed the procedure including the risks, benefits and alternatives for the proposed anesthesia with the patient or authorized representative who has indicated his/her understanding and acceptance.   Dental advisory given  Plan Discussed with: CRNA, Anesthesiologist and Surgeon  Anesthesia Plan Comments:        Anesthesia Quick Evaluation
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
## 2014-11-13 NOTE — Progress Notes (Signed)
    Subjective: Day of Surgery Procedure(s) (LRB):  IRRIGATION AND DEBRIDEMENT RIGHT ANKLE, complex wound closure, placement of Vac, and splinting of right ankle  (Right) Patient reports pain as 3 on 0-10 scale.   Denies CP or SOB.  Voiding without difficulty. Positive flatus. Objective: Vital signs in last 24 hours: Temp:  [97.9 F (36.6 C)-98.7 F (37.1 C)] 98.4 F (36.9 C) (10/05 0348) Pulse Rate:  [57-93] 67 (10/05 0348) Resp:  [11-26] 14 (10/05 0348) BP: (111-150)/(66-97) 134/74 mmHg (10/05 0348) SpO2:  [97 %-100 %] 97 % (10/05 0349) FiO2 (%):  [0 %] 0 % (10/05 0349) Weight:  [68.04 kg (150 lb)] 68.04 kg (150 lb) (10/04 2301)  Intake/Output from previous day: 10/04 0701 - 10/05 0700 In: 1164.3 [I.V.:1114.3; IV Piggyback:50] Out: -  Intake/Output this shift:    Labs:  Recent Labs  11/12/14 2251  HGB 12.5*    Recent Labs  11/12/14 2251  WBC 5.8  RBC 3.98*  HCT 37.9*  PLT 233    Recent Labs  11/12/14 2251  NA 139  K 3.4*  CL 105  CO2 27  BUN 12  CREATININE 0.81  GLUCOSE 95  CALCIUM 8.9   No results for input(s): LABPT, INR in the last 72 hours.  Physical Exam: Neurologically intact Compartment soft splint in good condition  Vac functioning well  Assessment/Plan: Day of Surgery Procedure(s) (LRB):  IRRIGATION AND DEBRIDEMENT RIGHT ANKLE, complex wound closure, placement of Vac, and splinting of right ankle  (Right) Spoke with Dr Victorino Dike - he will review images and determine time for definitive fracture management  Clancy Mullarkey D for Dr. Venita Lick [facility] [phone] 11/13/2014, 9:12 AM

## 2014-11-13 NOTE — Transfer of Care (Signed)
Immediate Anesthesia Transfer of Care Note  Patient: John Lozano  Procedure(s) Performed: Procedure(s):  IRRIGATION AND DEBRIDEMENT RIGHT ANKLE, complex wound closure, placement of Vac, and splinting of right ankle  (Right)  Patient Location: PACU  Anesthesia Type:General  Level of Consciousness: awake, alert  and oriented  Airway & Oxygen Therapy: Patient connected to nasal cannula oxygen  Post-op Assessment: Report given to RN, Post -op Vital signs reviewed and stable and Patient moving all extremities X 4  Post vital signs: Reviewed and stable  Last Vitals:  Filed Vitals:   11/13/14 0201  BP: 136/90  Pulse: 93  Temp: 36.6 C  Resp: 13    Complications: No apparent anesthesia complications

## 2014-11-13 NOTE — Anesthesia Postprocedure Evaluation (Signed)
  Anesthesia Post-op Note  Patient: John Lozano  Procedure(s) Performed: Procedure(s):  IRRIGATION AND DEBRIDEMENT RIGHT ANKLE, complex wound closure, placement of Vac, and splinting of right ankle  (Right)  Patient Location: PACU  Anesthesia Type: General   Level of Consciousness: awake, alert  and oriented  Airway and Oxygen Therapy: Patient Spontanous Breathing  Post-op Pain: mild  Post-op Assessment: Post-op Vital signs reviewed  Post-op Vital Signs: Reviewed  Last Vitals:  Filed Vitals:   11/13/14 0348  BP: 134/74  Pulse: 67  Temp: 36.9 C  Resp: 14    Complications: No apparent anesthesia complications

## 2014-11-13 NOTE — Care Management Note (Signed)
Case Management Note  Patient Details  Name: John Lozano MRN: [id number] Date of Birth: 1980/05/20  Subjective/Objective: 34 yr old male s/p I & D of right ankle fracture with incisional wound vac placement. Patient has Prevena VAC.                   Action/Plan: Case manager provided patient with contact number for Rickie Toye with [employer]. Instructed him to contact her with any problems with wound vac. Patient will need to see MD for f/u no later than Tuesday Oct. 11, 2016.   Expected Discharge Date:                  Expected Discharge Plan:  Home/Self Care  In-House Referral:     Discharge planning Services  CM Consult  Post Acute Care Choice:    Choice offered to:     DME Arranged:    DME Agency:     HH Arranged:    HH Agency:     Status of Service:  In process, will continue to follow  Medicare Important Message Given:    Date Medicare IM Given:    Medicare IM give by:    Date Additional Medicare IM Given:    Additional Medicare Important Message give by:     If discussed at Long Length of Stay Meetings, dates discussed:    Additional Comments:  Durenda Guthrie, RN 11/13/2014, 10:22 AM

## 2014-11-13 NOTE — Anesthesia Procedure Notes (Signed)
Procedure Name: Intubation Date/Time: 11/13/2014 12:54 AM Performed by: Molli Hazard Pre-anesthesia Checklist: Patient identified, Emergency Drugs available, Suction available and Patient being monitored Patient Re-evaluated:Patient Re-evaluated prior to inductionOxygen Delivery Method: Circle system utilized Preoxygenation: Pre-oxygenation with 100% oxygen Intubation Type: IV induction, Rapid sequence and Cricoid Pressure applied Laryngoscope Size: Miller and 2 Grade View: Grade I Tube type: Oral Number of attempts: 1 Airway Equipment and Method: Stylet Placement Confirmation: ETT inserted through vocal cords under direct vision,  positive ETCO2 and breath sounds checked- equal and bilateral Secured at: 23 cm Tube secured with: Tape Dental Injury: Teeth and Oropharynx as per pre-operative assessment

## 2014-11-13 NOTE — Care Management (Signed)
Utilization review completed. Zyon Rosser, RN Case Manager [phone]. 

## 2014-11-13 NOTE — Progress Notes (Signed)
34 y/o male with right ankle open fracture s/p I and D and splinting by Dr. Shon Baton this morning.  We'll plan to d/c the wound VAC and d/c the pt to home.  Plan OR for next week.  He can f/u with me to check his skin early next week.  He understands the plan and agrees.

## 2014-11-14 ENCOUNTER — Encounter (HOSPITAL_COMMUNITY): Payer: Self-pay | Admitting: Orthopedic Surgery

## 2014-11-14 MED ORDER — METHOCARBAMOL 500 MG PO TABS: 500.0000 mg | ORAL_TABLET | Freq: Three times a day (TID) | ORAL | Status: DC | PRN

## 2014-11-14 MED ORDER — OXYCODONE-ACETAMINOPHEN 10-325 MG PO TABS: 1.0000 | ORAL_TABLET | ORAL | Status: DC | PRN
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
## 2014-11-14 NOTE — Progress Notes (Signed)
[name] discharged home per MD order. Discharge instructions reviewed and discussed with patient. All questions and concerns answered. Copy of instructions and scripts given to patient. IV removed.  Patient escorted to car by staff in a wheelchair. No distress noted upon discharge.   [name] R 11/14/2014 2:55 PM

## 2014-11-14 NOTE — Progress Notes (Signed)
    Subjective: 1 Day Post-Op Procedure(s) (LRB):  IRRIGATION AND DEBRIDEMENT RIGHT ANKLE, complex wound closure, placement of Vac, and splinting of right ankle  (Right) Patient reports pain as 3 on 0-10 scale.   Denies CP or SOB.  Voiding without difficulty. Positive flatus. Objective: Vital signs in last 24 hours: Temp:  [98.7 F (37.1 C)-98.9 F (37.2 C)] 98.7 F (37.1 C) (10/06 0645) Pulse Rate:  [77-81] 81 (10/06 0645) Resp:  [16] 16 (10/06 0645) BP: (127-130)/(72-82) 130/72 mmHg (10/06 0645) SpO2:  [98 %] 98 % (10/06 0645)  Intake/Output from previous day: 10/05 0701 - 10/06 0700 In: -  Out: 800 [Urine:800] Intake/Output this shift:    Labs:  Recent Labs  11/12/14 2251  HGB 12.5*    Recent Labs  11/12/14 2251  WBC 5.8  RBC 3.98*  HCT 37.9*  PLT 233    Recent Labs  11/12/14 2251  NA 139  K 3.4*  CL 105  CO2 27  BUN 12  CREATININE 0.81  GLUCOSE 95  CALCIUM 8.9   No results for input(s): LABPT, INR in the last 72 hours.  Physical Exam: Neurologically intact Neurovascular intact Intact pulses distally Incision: scant drainage and no significant fx blisters noted Compartment soft  Assessment/Plan: 1 Day Post-Op Procedure(s) (LRB):  IRRIGATION AND DEBRIDEMENT RIGHT ANKLE, complex wound closure, placement of Vac, and splinting of right ankle  (Right) Wound vac removed and dressing applied Splint replaced  Plan on d/c today - NWB F/U Monday with Dr Victorino Dike for re-evaluation  Venita Lick D for Dr. Venita Lick [facility] [phone] 11/14/2014, 8:46 AM

## 2014-11-20 ENCOUNTER — Other Ambulatory Visit: Payer: Self-pay | Admitting: Orthopedic Surgery

## 2014-11-22 ENCOUNTER — Encounter (HOSPITAL_COMMUNITY): Payer: Self-pay | Admitting: *Deleted

## 2014-11-23 ENCOUNTER — Encounter (HOSPITAL_COMMUNITY): Payer: Self-pay | Admitting: *Deleted

## 2014-11-23 ENCOUNTER — Emergency Department (HOSPITAL_COMMUNITY)
Admission: EM | Admit: 2014-11-23 | Discharge: 2014-11-24 | Disposition: A | Discharge status: Home / Self Care | Payer: Self-pay | Attending: Emergency Medicine | Admitting: Emergency Medicine

## 2014-11-23 ENCOUNTER — Encounter (HOSPITAL_COMMUNITY): Payer: Self-pay | Admitting: Emergency Medicine

## 2014-11-23 ENCOUNTER — Ambulatory Visit (HOSPITAL_COMMUNITY): Payer: Self-pay | Admitting: Certified Registered Nurse Anesthetist

## 2014-11-23 ENCOUNTER — Encounter (HOSPITAL_COMMUNITY)
Admission: RE | Disposition: A | Discharge status: Home / Self Care | Payer: Self-pay | Source: Ambulatory Visit | Attending: Orthopedic Surgery

## 2014-11-23 ENCOUNTER — Ambulatory Visit (HOSPITAL_COMMUNITY)
Admission: RE | Admit: 2014-11-23 | Discharge: 2014-11-23 | Disposition: A | Discharge status: Home / Self Care | Payer: Self-pay | Source: Ambulatory Visit | Attending: Orthopedic Surgery | Admitting: Orthopedic Surgery

## 2014-11-23 DIAGNOSIS — K219 Gastro-esophageal reflux disease without esophagitis: Secondary | ICD-10-CM | POA: Insufficient documentation

## 2014-11-23 DIAGNOSIS — Z720 Tobacco use: Secondary | ICD-10-CM | POA: Insufficient documentation

## 2014-11-23 DIAGNOSIS — Z87442 Personal history of urinary calculi: Secondary | ICD-10-CM | POA: Insufficient documentation

## 2014-11-23 DIAGNOSIS — G8918 Other acute postprocedural pain: Secondary | ICD-10-CM | POA: Insufficient documentation

## 2014-11-23 DIAGNOSIS — F17290 Nicotine dependence, other tobacco product, uncomplicated: Secondary | ICD-10-CM | POA: Insufficient documentation

## 2014-11-23 DIAGNOSIS — J45909 Unspecified asthma, uncomplicated: Secondary | ICD-10-CM | POA: Insufficient documentation

## 2014-11-23 DIAGNOSIS — S92251B Displaced fracture of navicular [scaphoid] of right foot, initial encounter for open fracture: Secondary | ICD-10-CM | POA: Insufficient documentation

## 2014-11-23 DIAGNOSIS — Z7982 Long term (current) use of aspirin: Secondary | ICD-10-CM | POA: Insufficient documentation

## 2014-11-23 DIAGNOSIS — S91011A Laceration without foreign body, right ankle, initial encounter: Secondary | ICD-10-CM | POA: Insufficient documentation

## 2014-11-23 DIAGNOSIS — Z8614 Personal history of Methicillin resistant Staphylococcus aureus infection: Secondary | ICD-10-CM | POA: Insufficient documentation

## 2014-11-23 DIAGNOSIS — S86191A Other injury of other muscle(s) and tendon(s) of posterior muscle group at lower leg level, right leg, initial encounter: Secondary | ICD-10-CM | POA: Insufficient documentation

## 2014-11-23 DIAGNOSIS — M79604 Pain in right leg: Secondary | ICD-10-CM | POA: Insufficient documentation

## 2014-11-23 DIAGNOSIS — S82841B Displaced bimalleolar fracture of right lower leg, initial encounter for open fracture type I or II: Secondary | ICD-10-CM | POA: Insufficient documentation

## 2014-11-23 DIAGNOSIS — Z79899 Other long term (current) drug therapy: Secondary | ICD-10-CM | POA: Insufficient documentation

## 2014-11-23 HISTORY — DX: Calculus of kidney: N20.0

## 2014-11-23 HISTORY — PX: ORIF ANKLE FRACTURE: SHX5408

## 2014-11-23 HISTORY — DX: Gastro-esophageal reflux disease without esophagitis: K21.9

## 2014-11-23 LAB — SURGICAL PCR SCREEN
MRSA, PCR: NEGATIVE
Staphylococcus aureus: POSITIVE — AB

## 2014-11-23 SURGERY — OPEN REDUCTION INTERNAL FIXATION (ORIF) ANKLE FRACTURE
Anesthesia: Regional | Site: Ankle | Laterality: Right

## 2014-11-23 MED ORDER — OXYCODONE-ACETAMINOPHEN 10-325 MG PO TABS: 1.0000 | ORAL_TABLET | ORAL | Status: DC | PRN

## 2014-11-23 MED ORDER — VANCOMYCIN HCL IN DEXTROSE 1-5 GM/200ML-% IV SOLN
INTRAVENOUS | Status: AC
  Administered 2014-11-23: 1000 mg via INTRAVENOUS
  Filled 2014-11-23: qty 200

## 2014-11-23 MED ORDER — SODIUM CHLORIDE 0.9 % IR SOLN
Status: DC | PRN
  Administered 2014-11-23: 3000 mL

## 2014-11-23 MED ORDER — ONDANSETRON HCL 4 MG/2ML IJ SOLN
INTRAMUSCULAR | Status: DC | PRN
  Administered 2014-11-23: 4 mg via INTRAVENOUS

## 2014-11-23 MED ORDER — DOCUSATE SODIUM 100 MG PO CAPS: 100.0000 mg | ORAL_CAPSULE | Freq: Two times a day (BID) | ORAL | Status: DC

## 2014-11-23 MED ORDER — ASPIRIN EC 325 MG PO TBEC: 325.0000 mg | DELAYED_RELEASE_TABLET | Freq: Every day | ORAL | Status: DC

## 2014-11-23 MED ORDER — PROPOFOL 10 MG/ML IV BOLUS
INTRAVENOUS | Status: DC | PRN
  Administered 2014-11-23: 200 mg via INTRAVENOUS

## 2014-11-23 MED ORDER — SODIUM CHLORIDE 0.9 % IV SOLN: INTRAVENOUS | Status: DC

## 2014-11-23 MED ORDER — PROPOFOL 10 MG/ML IV BOLUS
INTRAVENOUS | Status: AC
  Filled 2014-11-23: qty 20

## 2014-11-23 MED ORDER — MIDAZOLAM HCL 2 MG/2ML IJ SOLN
INTRAMUSCULAR | Status: DC | PRN
  Administered 2014-11-23: 2 mg via INTRAVENOUS

## 2014-11-23 MED ORDER — MUPIROCIN 2 % EX OINT
TOPICAL_OINTMENT | CUTANEOUS | Status: AC
  Administered 2014-11-23: 1 via TOPICAL
  Filled 2014-11-23: qty 22

## 2014-11-23 MED ORDER — BUPIVACAINE-EPINEPHRINE (PF) 0.5% -1:200000 IJ SOLN
INTRAMUSCULAR | Status: DC | PRN
  Administered 2014-11-23: 30 mL via PERINEURAL

## 2014-11-23 MED ORDER — ONDANSETRON HCL 4 MG/2ML IJ SOLN
INTRAMUSCULAR | Status: AC
  Filled 2014-11-23: qty 2

## 2014-11-23 MED ORDER — HYDROMORPHONE HCL 1 MG/ML IJ SOLN: 0.2500 mg | INTRAMUSCULAR | Status: DC | PRN

## 2014-11-23 MED ORDER — HYDROMORPHONE HCL 1 MG/ML IJ SOLN
1.0000 mg | Freq: Once | INTRAMUSCULAR | Status: AC
  Administered 2014-11-24: 1 mg via INTRAVENOUS
  Filled 2014-11-23: qty 1

## 2014-11-23 MED ORDER — 0.9 % SODIUM CHLORIDE (POUR BTL) OPTIME
TOPICAL | Status: DC | PRN
  Administered 2014-11-23: 1000 mL

## 2014-11-23 MED ORDER — FENTANYL CITRATE (PF) 250 MCG/5ML IJ SOLN
INTRAMUSCULAR | Status: DC | PRN
  Administered 2014-11-23 (×4): 50 ug via INTRAVENOUS
  Administered 2014-11-23: 100 ug via INTRAVENOUS
  Administered 2014-11-23: 50 ug via INTRAVENOUS
  Administered 2014-11-23 (×2): 25 ug via INTRAVENOUS

## 2014-11-23 MED ORDER — MUPIROCIN 2 % EX OINT
1.0000 "application " | TOPICAL_OINTMENT | Freq: Once | CUTANEOUS | Status: AC
  Administered 2014-11-23: 1 via TOPICAL

## 2014-11-23 MED ORDER — CHLORHEXIDINE GLUCONATE 4 % EX LIQD: 60.0000 mL | Freq: Once | CUTANEOUS | Status: DC

## 2014-11-23 MED ORDER — LACTATED RINGERS IV SOLN
INTRAVENOUS | Status: DC | PRN
  Administered 2014-11-23 (×2): via INTRAVENOUS

## 2014-11-23 MED ORDER — FENTANYL CITRATE (PF) 250 MCG/5ML IJ SOLN
INTRAMUSCULAR | Status: AC
Start: 2014-11-23 — End: 2014-11-23
  Filled 2014-11-23: qty 5

## 2014-11-23 MED ORDER — FENTANYL CITRATE (PF) 250 MCG/5ML IJ SOLN
INTRAMUSCULAR | Status: AC
  Filled 2014-11-23: qty 5

## 2014-11-23 MED ORDER — ONDANSETRON HCL 4 MG/2ML IJ SOLN
4.0000 mg | Freq: Once | INTRAMUSCULAR | Status: AC
  Administered 2014-11-24: 4 mg via INTRAVENOUS
  Filled 2014-11-23: qty 2

## 2014-11-23 MED ORDER — MIDAZOLAM HCL 2 MG/2ML IJ SOLN
INTRAMUSCULAR | Status: AC
  Filled 2014-11-23: qty 4

## 2014-11-23 MED ORDER — CEFAZOLIN SODIUM-DEXTROSE 2-3 GM-% IV SOLR: 2.0000 g | INTRAVENOUS | Status: DC

## 2014-11-23 MED ORDER — ROCURONIUM BROMIDE 50 MG/5ML IV SOLN
INTRAVENOUS | Status: AC
  Filled 2014-11-23: qty 1

## 2014-11-23 MED ORDER — SENNOSIDES 8.6 MG PO TABS: 2.0000 | ORAL_TABLET | Freq: Every day | ORAL | Status: DC

## 2014-11-23 MED ORDER — BUPIVACAINE HCL (PF) 0.5 % IJ SOLN
INTRAMUSCULAR | Status: DC | PRN
  Administered 2014-11-23: 10 mL

## 2014-11-23 MED ORDER — CEFAZOLIN SODIUM-DEXTROSE 2-3 GM-% IV SOLR
INTRAVENOUS | Status: AC
  Filled 2014-11-23: qty 50

## 2014-11-23 MED ORDER — LIDOCAINE HCL (CARDIAC) 20 MG/ML IV SOLN
INTRAVENOUS | Status: DC | PRN
  Administered 2014-11-23: 20 mg via INTRAVENOUS

## 2014-11-23 MED ORDER — LIDOCAINE HCL (CARDIAC) 20 MG/ML IV SOLN
INTRAVENOUS | Status: AC
  Filled 2014-11-23: qty 5

## 2014-11-23 MED ORDER — SODIUM CHLORIDE 0.9 % IV BOLUS (SEPSIS)
1000.0000 mL | Freq: Once | INTRAVENOUS | Status: AC
  Administered 2014-11-24: 1000 mL via INTRAVENOUS

## 2014-11-23 SURGICAL SUPPLY — 80 items
BANDAGE ESMARK 6X9 LF (GAUZE/BANDAGES/DRESSINGS) ×1 IMPLANT
BIT DRILL 2.5X2.75 QC CALB (BIT) ×1 IMPLANT
BIT DRILL 3.5X5.5 QC CALB (BIT) ×1 IMPLANT
BLADE SURG 15 STRL LF DISP TIS (BLADE) ×1 IMPLANT
BLADE SURG 15 STRL SS (BLADE) ×2
BNDG CMPR 9X6 STRL LF SNTH (GAUZE/BANDAGES/DRESSINGS) ×1
BNDG COHESIVE 4X5 TAN STRL (GAUZE/BANDAGES/DRESSINGS) ×2 IMPLANT
BNDG COHESIVE 6X5 TAN STRL LF (GAUZE/BANDAGES/DRESSINGS) ×2 IMPLANT
BNDG ESMARK 6X9 LF (GAUZE/BANDAGES/DRESSINGS) ×2
CANISTER SUCT 3000ML PPV (MISCELLANEOUS) ×2 IMPLANT
CHLORAPREP W/TINT 26ML (MISCELLANEOUS) ×2 IMPLANT
COVER SURGICAL LIGHT HANDLE (MISCELLANEOUS) ×2 IMPLANT
CUFF TOURNIQUET SINGLE 34IN LL (TOURNIQUET CUFF) ×2 IMPLANT
CUFF TOURNIQUET SINGLE 44IN (TOURNIQUET CUFF) ×1 IMPLANT
DRAPE OEC MINIVIEW 54X84 (DRAPES) ×2 IMPLANT
DRAPE U-SHAPE 47X51 STRL (DRAPES) ×2 IMPLANT
DRILL 1.6MM (BIT) ×1 IMPLANT
DRSG ADAPTIC 3X8 NADH LF (GAUZE/BANDAGES/DRESSINGS) IMPLANT
DRSG MEPITEL 4X7.2 (GAUZE/BANDAGES/DRESSINGS) ×1 IMPLANT
DRSG PAD ABDOMINAL 8X10 ST (GAUZE/BANDAGES/DRESSINGS) ×3 IMPLANT
ELECT REM PT RETURN 9FT ADLT (ELECTROSURGICAL) ×2
ELECTRODE REM PT RTRN 9FT ADLT (ELECTROSURGICAL) ×1 IMPLANT
GAUZE SPONGE 4X4 12PLY STRL (GAUZE/BANDAGES/DRESSINGS) ×1 IMPLANT
GLOVE BIO SURGEON STRL SZ7 (GLOVE) ×4 IMPLANT
GLOVE BIO SURGEON STRL SZ8 (GLOVE) ×2 IMPLANT
GLOVE BIOGEL PI IND STRL 7.5 (GLOVE) ×1 IMPLANT
GLOVE BIOGEL PI IND STRL 8 (GLOVE) ×1 IMPLANT
GLOVE BIOGEL PI INDICATOR 7.5 (GLOVE) ×1
GLOVE BIOGEL PI INDICATOR 8 (GLOVE) ×1
GOWN STRL REUS W/ TWL LRG LVL3 (GOWN DISPOSABLE) ×2 IMPLANT
GOWN STRL REUS W/ TWL XL LVL3 (GOWN DISPOSABLE) ×1 IMPLANT
GOWN STRL REUS W/TWL LRG LVL3 (GOWN DISPOSABLE) ×4
GOWN STRL REUS W/TWL XL LVL3 (GOWN DISPOSABLE) ×2
KIT BASIN OR (CUSTOM PROCEDURE TRAY) ×2 IMPLANT
KIT ROOM TURNOVER OR (KITS) ×2 IMPLANT
NEEDLE 22X1 1/2 (OR ONLY) (NEEDLE) IMPLANT
NS IRRIG 1000ML POUR BTL (IV SOLUTION) ×2 IMPLANT
PACK ORTHO EXTREMITY (CUSTOM PROCEDURE TRAY) ×2 IMPLANT
PAD ARMBOARD 7.5X6 YLW CONV (MISCELLANEOUS) ×4 IMPLANT
PAD CAST 4YDX4 CTTN HI CHSV (CAST SUPPLIES) ×1 IMPLANT
PADDING CAST ABS 4INX4YD NS (CAST SUPPLIES) ×1
PADDING CAST ABS 6INX4YD NS (CAST SUPPLIES) ×1
PADDING CAST ABS COTTON 4X4 ST (CAST SUPPLIES) IMPLANT
PADDING CAST ABS COTTON 6X4 NS (CAST SUPPLIES) IMPLANT
PADDING CAST COTTON 4X4 STRL (CAST SUPPLIES) ×2
PLATE ACE 100DEG 6HOLE (Plate) ×1 IMPLANT
PLATE ACE 3.5MM 2HOLE (Plate) ×1 IMPLANT
SCREW CORTICAL 3.5MM  16MM (Screw) ×1 IMPLANT
SCREW CORTICAL 3.5MM  20MM (Screw) ×1 IMPLANT
SCREW CORTICAL 3.5MM  44MM (Screw) ×1 IMPLANT
SCREW CORTICAL 3.5MM 14MM (Screw) ×1 IMPLANT
SCREW CORTICAL 3.5MM 16MM (Screw) IMPLANT
SCREW CORTICAL 3.5MM 18MM (Screw) ×1 IMPLANT
SCREW CORTICAL 3.5MM 20MM (Screw) IMPLANT
SCREW CORTICAL 3.5MM 40MM (Screw) ×1 IMPLANT
SCREW CORTICAL 3.5MM 44MM (Screw) IMPLANT
SCREW CORTICAL 3.5MM 50MM (Screw) ×1 IMPLANT
SPLINT PLASTER CAST XFAST 5X30 (CAST SUPPLIES) IMPLANT
SPLINT PLASTER XFAST SET 5X30 (CAST SUPPLIES) ×1
SPONGE LAP 18X18 X RAY DECT (DISPOSABLE) ×2 IMPLANT
STAPLER VISISTAT 35W (STAPLE) IMPLANT
SUCTION FRAZIER TIP 10 FR DISP (SUCTIONS) ×2 IMPLANT
SUT ETHILON 2 0 PSLX (SUTURE) ×4 IMPLANT
SUT MNCRL AB 3-0 PS2 18 (SUTURE) ×2 IMPLANT
SUT PDS AB 0 CT 36 (SUTURE) ×1 IMPLANT
SUT PDS AB 1 CT  36 (SUTURE) ×1
SUT PDS AB 1 CT 36 (SUTURE) IMPLANT
SUT PROLENE 3 0 PS 2 (SUTURE) ×2 IMPLANT
SUT VIC AB 2-0 CT1 27 (SUTURE) ×4
SUT VIC AB 2-0 CT1 TAPERPNT 27 (SUTURE) ×2 IMPLANT
SUT VIC AB 3-0 PS2 18 (SUTURE)
SUT VIC AB 3-0 PS2 18XBRD (SUTURE) ×1 IMPLANT
SYR CONTROL 10ML LL (SYRINGE) IMPLANT
TOWEL OR 17X24 6PK STRL BLUE (TOWEL DISPOSABLE) ×2 IMPLANT
TOWEL OR 17X26 10 PK STRL BLUE (TOWEL DISPOSABLE) ×2 IMPLANT
TUBE CONNECTING 12X1/4 (SUCTIONS) ×2 IMPLANT
WASHER FLAT ACE (Orthopedic Implant) ×1 IMPLANT
WASHER PLAIN FLAT ACE NS 3PK (Orthopedic Implant) IMPLANT
WATER STERILE IRR 1000ML POUR (IV SOLUTION) ×2 IMPLANT
WIRE K 1.6MM 144256 (MISCELLANEOUS) ×1 IMPLANT

## 2014-11-23 NOTE — H&P (Signed)
John Lozano is an 34 y.o. male.   Chief Complaint: right ankle injury HPI: 34 y/o male with PMH Of MRSA infection in the remote past sustained an open right ankle fracture over a week ago.  He underwent I and D of the open fracture and closure of the wound by Dr. Shon BatonBrooks.  He presents today for repeat I and D and ORIF of the of the open fracture.  Past Medical History  Diagnosis Date  . Asthma   . GERD (gastroesophageal reflux disease)   . Kidney stones     Past Surgical History  Procedure Laterality Date  . I&d extremity Right 11/13/2014    Procedure:  IRRIGATION AND DEBRIDEMENT RIGHT ANKLE, complex wound closure, placement of Vac, and splinting of right ankle ;  Surgeon: Venita Lickahari Brooks, MD;  Location: [facility] OR;  Service: Orthopedics;  Laterality: Right;  . Wisdom tooth extraction      Family History  Problem Relation Age of Onset  . Healthy Mother   . Healthy Father    Social History:  reports that he has been smoking E-cigarettes.  He has never used smokeless tobacco. He reports that he drinks alcohol. He reports that he does not use illicit drugs.  Allergies:  Allergies  Allergen Reactions  . Cats Claw [Uncaria Tomentosa (Cats Claw)]   . Shellfish Allergy Swelling    Medications Prior to Admission  Medication Sig Dispense Refill  . methocarbamol (ROBAXIN) 500 MG tablet Take 1 tablet (500 mg total) by mouth 3 (three) times daily as needed for muscle spasms. 60 tablet 0  . Multiple Vitamin (MULTIVITAMIN WITH MINERALS) TABS tablet Take 1 tablet by mouth daily.    Marland Kitchen. omeprazole-sodium bicarbonate (ZEGERID) 40-1100 MG capsule Take 1 capsule by mouth daily before breakfast.    . oxyCODONE-acetaminophen (PERCOCET) 10-325 MG tablet Take 1 tablet by mouth every 4 (four) hours as needed for pain. 60 tablet 0  . vitamin B-12 (CYANOCOBALAMIN) 1000 MCG tablet Take 1,000 mcg by mouth daily.      No results found for this or any previous visit (from the past 48 hour(s)). No results  found.  ROS  No recent f/c/n/v/wt loss  Blood pressure 119/72, pulse 75, temperature 98.2 F (36.8 C), temperature source Oral, resp. rate 16, SpO2 99 %. Physical Exam  wn wd male in nad.  A and O x 4.  Mood and affect normal.  EOMI.  resp unlabored.  R ankle with medial laceration about 20 cm long.  Sutures in place.  No signs of infection.  No lymphadenopathy.  5/5 strength in PF and DF of the toes.  Sens to LT intact at the toes.  Brisk cap refill at the toes.  Assessment/Plan R ankle open fracture s/p I and D, closed reduction and splinting - to OR for repeat I and D and ORIF.  The risks and benefits of the alternative treatment options have been discussed in detail.  The patient wishes to proceed with surgery and specifically understands risks of bleeding, infection, nerve damage, blood clots, need for additional surgery, amputation and death.   Toni ArthursHEWITT, Riley Hallum 11/23/2014, 7:20 AM

## 2014-11-23 NOTE — Transfer of Care (Signed)
Immediate Anesthesia Transfer of Care Note  Patient: John SchwalbeBradley W Lozano  Procedure(s) Performed: Procedure(s): OPEN REDUCTION INTERNAL FIXATION (ORIF) RIGHT  BIMALLEOLAR ANKLE FRACTURE (Right)  Patient Location: PACU  Anesthesia Type:General  Level of Consciousness: awake  Airway & Oxygen Therapy: Patient Spontanous Breathing and Patient connected to nasal cannula oxygen  Post-op Assessment: Report given to RN and Post -op Vital signs reviewed and stable  Post vital signs: Reviewed and stable  Last Vitals:  Filed Vitals:   11/23/14 0638  BP: 119/72  Pulse: 75  Temp: 36.8 C  Resp: 16    Complications: No apparent anesthesia complications

## 2014-11-23 NOTE — Anesthesia Postprocedure Evaluation (Signed)
  Anesthesia Post-op Note  Patient: John Lozano  Procedure(s) Performed: Procedure(s): OPEN REDUCTION INTERNAL FIXATION (ORIF) RIGHT  BIMALLEOLAR ANKLE FRACTURE (Right)  Patient Location: PACU  Anesthesia Type:General and block  Level of Consciousness: awake and alert   Airway and Oxygen Therapy: Patient Spontanous Breathing  Post-op Pain: Controlled  Post-op Assessment: Post-op Vital signs reviewed, Patient's Cardiovascular Status Stable and Respiratory Function Stable  Post-op Vital Signs: Reviewed  Filed Vitals:   11/23/14 1045  BP: 126/94  Pulse: 67  Temp:   Resp: 16    Complications: No apparent anesthesia complications

## 2014-11-23 NOTE — ED Notes (Signed)
Patient here post surgical repair of right lower extremity. States surgery was early this AM, nerve block was placed, block has worn off, and now his leg is extremely painful. Splint noted on extremity. Cap refill sluggish but present in toes. Toes cool. Unable to assess pulses because of splint. Denies other complaints.

## 2014-11-23 NOTE — Discharge Instructions (Signed)
John ArthursJohn Jalayia Bagheri, MD [facility]  Please read the following information regarding your care after surgery.  Medications  You only need a prescription for the narcotic pain medicine (ex. oxycodone, Percocet, Norco).  All of the other medicines listed below are available over the counter. X Aleve 2 pills twice a day for 5 days X oxycodone as prescribed for moderate to severe pain   Narcotic pain medicine (ex. oxycodone, Percocet, Vicodin) will cause constipation.  To prevent this problem, take the following medicines while you are taking any pain medicine. X docusate sodium (Colace) 100 mg twice a day X senna (Senokot) 2 tablets twice a day  X To help prevent blood clots, take an aspirin (325 mg) once a day for a month after surgery.  You should also get up every hour while you are awake to move around.    Weight Bearing ? Bear weight when you are able on your operated leg or foot. ? Bear weight only on the heel of your operated foot in the post-op shoe. X Do not bear any weight on the operated leg or foot.  Cast / Splint / Dressing X Keep your splint or cast clean and dry.  Dont put anything (coat hanger, pencil, etc) down inside of it.  If it gets damp, use a hair dryer on the cool setting to dry it.  If it gets soaked, call the office to schedule an appointment for a cast change. ? Remove your dressing 3 days after surgery and cover the incisions with dry dressings.    After your dressing, cast or splint is removed; you may shower, but do not soak or scrub the wound.  Allow the water to run over it, and then gently pat it dry.  Swelling It is normal for you to have swelling where you had surgery.  To reduce swelling and pain, keep your toes above your nose for at least 3 days after surgery.  It may be necessary to keep your foot or leg elevated for several weeks.  If it hurts, it should be elevated.  Follow Up Call my office at [phone] when you are discharged from the  hospital or surgery center to schedule an appointment to be seen two weeks after surgery.  Call my office at [phone] if you develop a fever >101.5 F, nausea, vomiting, bleeding from the surgical site or severe pain.

## 2014-11-23 NOTE — Anesthesia Preprocedure Evaluation (Addendum)
Anesthesia Evaluation  Patient identified by MRN, date of birth, ID band Patient awake    Reviewed: Allergy & Precautions, H&P , NPO status , Patient's Chart, lab work & pertinent test results  Airway Mallampati: I  TM Distance: >3 FB Neck ROM: Full    Dental no notable dental hx. (+) Chipped, Teeth Intact,    Pulmonary asthma , Current Smoker,    Pulmonary exam normal breath sounds clear to auscultation       Cardiovascular negative cardio ROS   Rhythm:Regular Rate:Normal     Neuro/Psych negative neurological ROS  negative psych ROS   GI/Hepatic negative GI ROS, Neg liver ROS,   Endo/Other  negative endocrine ROS  Renal/GU negative Renal ROS  negative genitourinary   Musculoskeletal   Abdominal   Peds  Hematology negative hematology ROS (+)   Anesthesia Other Findings   Reproductive/Obstetrics negative OB ROS                            Anesthesia Physical Anesthesia Plan  ASA: II  Anesthesia Plan: General and Regional   Post-op Pain Management: GA combined w/ Regional for post-op pain   Induction: Intravenous  Airway Management Planned: LMA  Additional Equipment:   Intra-op Plan:   Post-operative Plan: Extubation in OR  Informed Consent: I have reviewed the patients History and Physical, chart, labs and discussed the procedure including the risks, benefits and alternatives for the proposed anesthesia with the patient or authorized representative who has indicated his/her understanding and acceptance.   Dental advisory given  Plan Discussed with: CRNA  Anesthesia Plan Comments:         Anesthesia Quick Evaluation
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
## 2014-11-23 NOTE — Anesthesia Procedure Notes (Addendum)
Anesthesia Regional Block:  Popliteal block  Pre-Anesthetic Checklist: ,, timeout performed, Correct Patient, Correct Site, Correct Laterality, Correct Procedure, Correct Position, site marked, Risks and benefits discussed, pre-op evaluation, post-op pain management  Laterality: Right  Prep: Maximum Sterile Barrier Precautions used and chloraprep       Needles:  Injection technique: Single-shot  Needle Type: Echogenic Stimulator Needle     Needle Length: 9cm 9 cm Needle Gauge: 21 and 21 G    Additional Needles:  Procedures: ultrasound guided (picture in chart) and nerve stimulator Popliteal block  Nerve Stimulator or Paresthesia:  Response: Peroneal,  Response: Tibial,   Additional Responses:   Narrative:  Start time: 11/23/2014 7:05 AM End time: 11/23/2014 7:15 AM Injection made incrementally with aspirations every 5 mL. Anesthesiologist: Gaynelle AduFITZGERALD, WILLIAM  Additional Notes: 2% Lidocaine skin wheel. Saphenous block with 10cc of 0.5% Bupivicaine plain.   Procedure Name: LMA Insertion Date/Time: 11/23/2014 7:41 AM Performed by: Alanda AmassFRIEDMAN, Mabry Tift A Pre-anesthesia Checklist: Patient identified, Emergency Drugs available, Suction available, Patient being monitored and Timeout performed Patient Re-evaluated:Patient Re-evaluated prior to inductionOxygen Delivery Method: Circle system utilized Preoxygenation: Pre-oxygenation with 100% oxygen Intubation Type: IV induction LMA: LMA inserted LMA Size: 4.0 Number of attempts: 1 Placement Confirmation: positive ETCO2 and breath sounds checked- equal and bilateral Tube secured with: Tape Dental Injury: Teeth and Oropharynx as per pre-operative assessment
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
## 2014-11-23 NOTE — Brief Op Note (Signed)
11/23/2014  9:46 AM  PATIENT:  John Lozano  34 y.o. male  PRE-OPERATIVE DIAGNOSIS: 1.  Open right bimalleolar ankle fracture      2.  Right medial ankle laceration 20 cm long  POST-OPERATIVE DIAGNOSIS:   1.  Open right bimalleolar ankle fracture      2.  Right medial ankle laceration 20 cm long      3.  Right open navicular fracture      4.  Avulsion of right posterior tibial tendon from the navicular  Procedure(s): 1.  Irrigation and excisional debridement of open right ankle fracture including skin, subcutaneous tissue, muscle and bone   2.  Irrigation and excisional debridement of open right navicular fracture including skin, subcutaneous tissue and bone   3.  Repair of right posterior tibial tendon to the navicular   4.  ORIF right bimalleolar ankle fracture   5.  Complex closure of right medial ankle laceration 20 cm long   6.  AP, mortise and lateral xrays of the right ankle   7.  Stress exam of the right ankle under fluoro  SURGEON:  Toni ArthursJohn Zach Tietje, MD  ASSISTANT: n/a  ANESTHESIA:   General, regional  EBL:  minimal   TOURNIQUET:   Total Tourniquet Time Documented: Thigh (Right) - 81 minutes Total: Thigh (Right) - 81 minutes  COMPLICATIONS:  None apparent  DISPOSITION:  Extubated, awake and stable to recovery.  DICTATION ID:  [id number]

## 2014-11-24 MED ORDER — HYDROMORPHONE HCL 1 MG/ML IJ SOLN
1.0000 mg | Freq: Once | INTRAMUSCULAR | Status: AC
  Administered 2014-11-24: 1 mg via INTRAVENOUS
  Filled 2014-11-24: qty 1

## 2014-11-24 MED ORDER — SENNOSIDES 8.6 MG PO TABS: 2.0000 | ORAL_TABLET | Freq: Every day | ORAL | Status: DC

## 2014-11-24 MED ORDER — ONDANSETRON HCL 4 MG/2ML IJ SOLN: 4.0000 mg | Freq: Once | INTRAMUSCULAR | Status: DC

## 2014-11-24 MED ORDER — IBUPROFEN 600 MG PO TABS: 600.0000 mg | ORAL_TABLET | Freq: Three times a day (TID) | ORAL | Status: DC

## 2014-11-24 MED ORDER — KETOROLAC TROMETHAMINE 30 MG/ML IJ SOLN
30.0000 mg | Freq: Once | INTRAMUSCULAR | Status: AC
  Administered 2014-11-24: 30 mg via INTRAVENOUS
  Filled 2014-11-24: qty 1

## 2014-11-24 MED ORDER — OXYCODONE HCL 5 MG PO TABS
10.0000 mg | ORAL_TABLET | Freq: Once | ORAL | Status: AC
  Administered 2014-11-24: 10 mg via ORAL
  Filled 2014-11-24: qty 2

## 2014-11-24 MED ORDER — OXYCODONE HCL 10 MG PO TABS: 10.0000 mg | ORAL_TABLET | ORAL | Status: DC | PRN

## 2014-11-24 MED ORDER — FENTANYL CITRATE (PF) 100 MCG/2ML IJ SOLN
100.0000 ug | Freq: Once | INTRAMUSCULAR | Status: AC
  Administered 2014-11-24: 100 ug via INTRAVENOUS
  Filled 2014-11-24: qty 2

## 2014-11-24 NOTE — ED Notes (Signed)
Spoke to PrueOthro, in route.

## 2014-11-25 ENCOUNTER — Encounter (HOSPITAL_COMMUNITY): Payer: Self-pay | Admitting: Orthopedic Surgery

## 2014-12-20 NOTE — Discharge Summary (Signed)
Physician Discharge Summary  Patient ID: MABLE LASHLEY MRN: [id number] DOB/AGE: 32-Sep-1982 32 y.o.  Admit date: 11/12/2014 Discharge date: 12/20/2014  Admission Diagnoses:  Open Ankle Fracture  Discharge Diagnoses:  Active Problems:   Open ankle fracture   Past Medical History  Diagnosis Date  . Asthma   . GERD (gastroesophageal reflux disease)   . Kidney stones     Surgeries: Procedure(s):  IRRIGATION AND DEBRIDEMENT RIGHT ANKLE, complex wound closure, placement of Vac, and splinting of right ankle  on 11/12/2014 - 11/13/2014   Consultants (if any): Treatment Team:  Toni Arthurs, MD  Discharged Condition: Improved  Hospital Course: AUGUSTA HILBERT is an 34 y.o. male who was admitted 11/12/2014 with a diagnosis of Open Ankle Fracture and went to the operating room on 11/12/2014 - 11/13/2014 and underwent the above named procedures. The pt was discharged on 11/13/14 with a wound vac and scheduled for surgery with Dr. Victorino Dike the following week. Dr. Victorino Dike took over his care.    He was given perioperative antibiotics:  Anti-infectives    Start     Dose/Rate Route Frequency Ordered Stop   11/13/14 0500  ceFAZolin (ANCEF) IVPB 1 g/50 mL premix     1 g 100 mL/hr over 30 Minutes Intravenous Every 6 hours 11/13/14 0348 11/13/14 1754   11/13/14 0015  ceFAZolin (ANCEF) IVPB 1 g/50 mL premix  Status:  Discontinued     1 g 100 mL/hr over 30 Minutes Intravenous  Once 11/13/14 0010 11/13/14 0434   11/12/14 2300  ceFAZolin (ANCEF) IVPB 1 g/50 mL premix  Status:  Discontinued     1 g 100 mL/hr over 30 Minutes Intravenous  Once 11/12/14 2256 11/13/14 0053   11/12/14 2253  ceFAZolin (ANCEF) 2-3 GM-% IVPB SOLR    Comments:  Claybon Jabs   : cabinet override      11/12/14 2253 11/13/14 0009    .  He was given sequential compression devices, early ambulation, and TED for DVT prophylaxis.  He benefited maximally from the hospital stay and there were no complications.    Recent vital  signs:  Filed Vitals:   11/14/14 0645  BP: 130/72  Pulse: 81  Temp: 98.7 F (37.1 C)  Resp: 16    Recent laboratory studies:  Lab Results  Component Value Date   HGB 12.5* 11/12/2014   HGB 13.6 02/07/2007   Lab Results  Component Value Date   WBC 5.8 11/12/2014   PLT 233 11/12/2014   No results found for: INR Lab Results  Component Value Date   NA 139 11/12/2014   K 3.4* 11/12/2014   CL 105 11/12/2014   CO2 27 11/12/2014   BUN 12 11/12/2014   CREATININE 0.81 11/12/2014   GLUCOSE 95 11/12/2014    Discharge Medications:     Medication List    STOP taking these medications        amoxicillin 500 MG capsule  Commonly known as:  AMOXIL     ibuprofen 400 MG tablet  Commonly known as:  ADVIL,MOTRIN      TAKE these medications        methocarbamol 500 MG tablet  Commonly known as:  ROBAXIN  Take 1 tablet (500 mg total) by mouth 3 (three) times daily as needed for muscle spasms.     multivitamin with minerals Tabs tablet  Take 1 tablet by mouth daily.     omeprazole-sodium bicarbonate 40-1100 MG capsule  Commonly known as:  ZEGERID  Take 1 capsule  by mouth daily before breakfast.     vitamin B-12 1000 MCG tablet  Commonly known as:  CYANOCOBALAMIN  Take 1,000 mcg by mouth daily.        Diagnostic Studies: No results found.  Disposition: 01-Home or Self Care        Follow-up Information    Please follow up.   Why:  Should you have ANY problems with VAC please call Young BerryRickie Toye [phone]      Follow up with Toni ArthursHEWITT, JOHN, MD. Schedule an appointment as soon as possible for a visit on 11/18/2014.   Specialty:  Orthopedic Surgery   Why:  fracture follow up, For wound re-check   Contact information:   [address] [place] [address] [phone]        Signed: Kirt BoysMayo, Carmen Christina 12/20/2014, 1:21 PM
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
## 2017-08-18 ENCOUNTER — Ambulatory Visit (HOSPITAL_COMMUNITY)
Admission: EM | Admit: 2017-08-18 | Discharge: 2017-08-18 | Disposition: A | Discharge status: Home / Self Care | Payer: Self-pay | Attending: Family Medicine | Admitting: Family Medicine

## 2017-08-18 ENCOUNTER — Encounter (HOSPITAL_COMMUNITY): Payer: Self-pay | Admitting: Emergency Medicine

## 2017-08-18 DIAGNOSIS — Z87442 Personal history of urinary calculi: Secondary | ICD-10-CM | POA: Insufficient documentation

## 2017-08-18 DIAGNOSIS — K219 Gastro-esophageal reflux disease without esophagitis: Secondary | ICD-10-CM | POA: Insufficient documentation

## 2017-08-18 DIAGNOSIS — B9789 Other viral agents as the cause of diseases classified elsewhere: Secondary | ICD-10-CM | POA: Insufficient documentation

## 2017-08-18 DIAGNOSIS — J028 Acute pharyngitis due to other specified organisms: Secondary | ICD-10-CM | POA: Insufficient documentation

## 2017-08-18 DIAGNOSIS — J45909 Unspecified asthma, uncomplicated: Secondary | ICD-10-CM | POA: Insufficient documentation

## 2017-08-18 DIAGNOSIS — Z79899 Other long term (current) drug therapy: Secondary | ICD-10-CM | POA: Insufficient documentation

## 2017-08-18 DIAGNOSIS — F17290 Nicotine dependence, other tobacco product, uncomplicated: Secondary | ICD-10-CM | POA: Insufficient documentation

## 2017-08-18 DIAGNOSIS — Z7982 Long term (current) use of aspirin: Secondary | ICD-10-CM | POA: Insufficient documentation

## 2017-08-18 DIAGNOSIS — J029 Acute pharyngitis, unspecified: Secondary | ICD-10-CM

## 2017-08-18 LAB — POCT RAPID STREP A: STREPTOCOCCUS, GROUP A SCREEN (DIRECT): NEGATIVE

## 2017-08-18 NOTE — Discharge Instructions (Addendum)
It was nice meeting you!!  Your strep test was negative.  I believe this is virus that will resolve on its own with time.  You can use chloraseptic spray and throat lozenges for relief.  Continue tylenol and motrin for pain and fever. Continue your daily allergy medication.  We will send the strep swab for culture.  Follow up for worsening symptoms as needed.

## 2017-08-18 NOTE — ED Provider Notes (Addendum)
[facility]    CSN: [id number] Arrival date & time: 08/18/17  1046     History   Chief Complaint Chief Complaint  Patient presents with  . Sore Throat    HPI John Lozano is a 37 y.o. male.   Patient is a 37 year old male with sore throat since yesterday and low-grade fever.  He denies any cough, congestion, runny nose, sneezing, ear pain.  He denies any chest pain, shortness of breath.  He has been treating the fever and body aches with ibuprofen and Tylenol.  He has some improvement in symptoms today.  ROS per HPI      Past Medical History:  Diagnosis Date  . Asthma   . GERD (gastroesophageal reflux disease)   . Kidney stones     Patient Active Problem List   Diagnosis Date Noted  . Open ankle fracture 11/13/2014    Past Surgical History:  Procedure Laterality Date  . I&D EXTREMITY Right 11/13/2014   Procedure:  IRRIGATION AND DEBRIDEMENT RIGHT ANKLE, complex wound closure, placement of Vac, and splinting of right ankle ;  Surgeon: Venita Lickahari Brooks, MD;  [facility]: [facility];  Service: Orthopedics;  Laterality: Right;  . ORIF ANKLE FRACTURE Right 11/23/2014   Procedure: OPEN REDUCTION INTERNAL FIXATION (ORIF) RIGHT  BIMALLEOLAR ANKLE FRACTURE;  Surgeon: Toni ArthursJohn Hewitt, MD;  Location: [facility];  Service: Orthopedics;  Laterality: Right;  . WISDOM TOOTH EXTRACTION         Home Medications    Prior to Admission medications   Medication Sig Start Date End Date Taking? Authorizing Provider  aspirin EC 325 MG tablet Take 1 tablet (325 mg total) by mouth daily. Patient not taking: Reported on 08/18/2017 11/23/14   Toni ArthursHewitt, John, MD  docusate sodium (COLACE) 100 MG capsule Take 1 capsule (100 mg total) by mouth 2 (two) times daily. While taking narcotic pain medicine. Patient not taking: Reported on 08/18/2017 11/23/14   Toni ArthursHewitt, John, MD  ibuprofen (ADVIL,MOTRIN) 600 MG tablet Take 1 tablet (600 mg total) by mouth 3 (three) times daily. Patient not taking:  Reported on 08/18/2017 11/24/14   Mesner, Barbara CowerJason, MD  methocarbamol (ROBAXIN) 500 MG tablet Take 1 tablet (500 mg total) by mouth 3 (three) times daily as needed for muscle spasms. Patient not taking: Reported on 08/18/2017 11/14/14   Venita LickBrooks, Dahari, MD  Multiple Vitamin (MULTIVITAMIN WITH MINERALS) TABS tablet Take 1 tablet by mouth daily.    [provider]  omeprazole-sodium bicarbonate (ZEGERID) 40-1100 MG capsule Take 1 capsule by mouth daily before breakfast.    [provider]  ondansetron (ZOFRAN) 4 MG/2ML SOLN injection Inject 2 mLs (4 mg total) into the vein once. Patient not taking: Reported on 08/18/2017 11/24/14   Mesner, Barbara CowerJason, MD  oxyCODONE 10 MG TABS Take 1-2 tablets (10-20 mg total) by mouth every 4 (four) hours as needed for severe pain or breakthrough pain. Patient not taking: Reported on 08/18/2017 11/24/14   Mesner, Barbara CowerJason, MD  oxyCODONE-acetaminophen (PERCOCET) 10-325 MG tablet Take 1 tablet by mouth every 4 (four) hours as needed for pain. Patient not taking: Reported on 08/18/2017 11/23/14   Toni ArthursHewitt, John, MD  senna (SENOKOT) 8.6 MG tablet Take 2 tablets (17.2 mg total) by mouth daily. While taking narcotic pain medicine. Patient not taking: Reported on 08/18/2017 11/24/14   Mesner, Barbara CowerJason, MD  vitamin B-12 (CYANOCOBALAMIN) 1000 MCG tablet Take 1,000 mcg by mouth daily.    [provider]    Family History Family History  Problem Relation  Age of Onset  . Healthy Mother   . Healthy Father     Social History Social History   Tobacco Use  . Smoking status: Current Every Day Smoker    Types: E-cigarettes  . Smokeless tobacco: Never Used  Substance Use Topics  . Alcohol use: Yes    Comment: occasional drink  . Drug use: No     Allergies   Shellfish allergy   Review of Systems Review of Systems   Physical Exam Triage Vital Signs ED Triage Vitals [08/18/17 1142]  Enc Vitals Group     BP 121/84     Pulse Rate (!) 57     Resp 16      Temp 98.1 F (36.7 C)     Temp src      SpO2 99 %     Weight      Height      Head Circumference      Peak Flow      Pain Score      Pain Loc      Pain Edu?      Excl. in GC?    No data found.  Updated Vital Signs BP 121/84   Pulse (!) 57   Temp 98.1 F (36.7 C)   Resp 16   SpO2 99%   Visual Acuity Right Eye Distance:   Left Eye Distance:   Bilateral Distance:    Right Eye Near:   Left Eye Near:    Bilateral Near:     Physical Exam  Constitutional: He is oriented to person, place, and time. He appears well-developed and well-nourished.  HENT:  Right Ear: Hearing, tympanic membrane and ear canal normal.  Left Ear: Hearing, tympanic membrane and ear canal normal.  Mouth/Throat: Uvula is midline, oropharynx is clear and moist and mucous membranes are normal. No oral lesions. No uvula swelling. No oropharyngeal exudate, posterior oropharyngeal edema, posterior oropharyngeal erythema or tonsillar abscesses. Tonsils are 0 on the right. Tonsils are 0 on the left. No tonsillar exudate.  Mild erythema to oropharynx  Eyes: Pupils are equal, round, and reactive to light.  Cardiovascular: Normal rate, regular rhythm and normal heart sounds.  Pulmonary/Chest: Effort normal and breath sounds normal.  Neurological: He is alert and oriented to person, place, and time.  Skin: Skin is warm and dry.  Psychiatric: He has a normal mood and affect.  Nursing note and vitals reviewed.    UC Treatments / Results  Labs (all labs ordered are listed, but only abnormal results are displayed) Labs Reviewed  CULTURE, GROUP A STREP [facility])  POCT RAPID STREP A    EKG None  Radiology No results found.  Procedures Procedures (including critical care time)  Medications Ordered in UC Medications - No data to display  Initial Impression / Assessment and Plan / UC Course  I have reviewed the triage vital signs and the nursing notes.  Pertinent labs & imaging results that were  available during my care of the patient were reviewed by me and considered in my medical decision making (see chart for details).     Strep test negative.  Sent for culture.  Continue Tylenol and Motrin as needed for pain.  Follow-up as needed Final Clinical Impressions(s) / UC Diagnoses   Final diagnoses:  Viral pharyngitis     Discharge Instructions     It was nice meeting you!!  Your strep test was negative.  I believe this is virus that will resolve on its own  with time.  You can use chloraseptic spray and throat lozenges for relief.  Continue tylenol and motrin for pain and fever. Continue your daily allergy medication.  We will send the strep swab for culture.  Follow up for worsening symptoms as needed.     ED Prescriptions    None     Controlled Substance Prescriptions [place] Controlled Substance Registry consulted? Not Applicable   Janace Aris, NP 08/18/17 1217    Dahlia Byes A, NP 08/18/17 1217

## 2017-08-20 LAB — CULTURE, GROUP A STREP (THRC)

## 2019-02-23 ENCOUNTER — Ambulatory Visit: Payer: Self-pay | Attending: Internal Medicine

## 2019-02-23 DIAGNOSIS — Z20822 Contact with and (suspected) exposure to covid-19: Secondary | ICD-10-CM | POA: Insufficient documentation

## 2019-02-24 LAB — NOVEL CORONAVIRUS, NAA: SARS-CoV-2, NAA: NOT DETECTED

## 2020-08-20 ENCOUNTER — Ambulatory Visit (INDEPENDENT_AMBULATORY_CARE_PROVIDER_SITE_OTHER): Payer: BC Managed Care – PPO | Admitting: Nurse Practitioner

## 2020-08-20 ENCOUNTER — Other Ambulatory Visit: Payer: Self-pay

## 2020-08-20 ENCOUNTER — Encounter: Payer: Self-pay | Admitting: Nurse Practitioner

## 2020-08-20 VITALS — BP 138/86 | HR 73 | Temp 98.1°F | Ht 71.0 in | Wt 180.0 lb

## 2020-08-20 DIAGNOSIS — Z7689 Persons encountering health services in other specified circumstances: Secondary | ICD-10-CM

## 2020-08-20 DIAGNOSIS — R631 Polydipsia: Secondary | ICD-10-CM

## 2020-08-20 DIAGNOSIS — Z113 Encounter for screening for infections with a predominantly sexual mode of transmission: Secondary | ICD-10-CM

## 2020-08-20 DIAGNOSIS — J309 Allergic rhinitis, unspecified: Secondary | ICD-10-CM | POA: Insufficient documentation

## 2020-08-20 DIAGNOSIS — R768 Other specified abnormal immunological findings in serum: Secondary | ICD-10-CM

## 2020-08-20 DIAGNOSIS — Z114 Encounter for screening for human immunodeficiency virus [HIV]: Secondary | ICD-10-CM

## 2020-08-20 DIAGNOSIS — F1111 Opioid abuse, in remission: Secondary | ICD-10-CM

## 2020-08-20 DIAGNOSIS — J3081 Allergic rhinitis due to animal (cat) (dog) hair and dander: Secondary | ICD-10-CM | POA: Diagnosis not present

## 2020-08-20 DIAGNOSIS — R1013 Epigastric pain: Secondary | ICD-10-CM

## 2020-08-20 DIAGNOSIS — Z1159 Encounter for screening for other viral diseases: Secondary | ICD-10-CM | POA: Diagnosis not present

## 2020-08-20 DIAGNOSIS — R7309 Other abnormal glucose: Secondary | ICD-10-CM | POA: Diagnosis not present

## 2020-08-20 LAB — URINALYSIS, ROUTINE W REFLEX MICROSCOPIC
Bacteria, UA: NONE SEEN /HPF
Bilirubin Urine: NEGATIVE
Glucose, UA: NEGATIVE
Hyaline Cast: NONE SEEN /LPF
Ketones, ur: NEGATIVE
Leukocytes,Ua: NEGATIVE
Nitrite: NEGATIVE
Protein, ur: NEGATIVE
Specific Gravity, Urine: 1.025 (ref 1.001–1.035)
Squamous Epithelial / LPF: NONE SEEN /HPF (ref <=5)
pH: 6 (ref 5.0–8.0)

## 2020-08-20 LAB — MICROSCOPIC MESSAGE

## 2020-08-20 NOTE — Progress Notes (Signed)
Subjective:    Patient ID: John Lozano, male    DOB: [birth year], 40 y.o.   MRN: [id number]  HPI: John Lozano is a 40 y.o. male presenting for new patient visit to establish care.  Introduced to Publishing rights manager role and practice setting.  All questions answered.  Discussed provider/patient relationship and expectations.  Chief Complaint  Patient presents with   Establish Care    Always hungry, chks blood sugar at work, post addict, told that he may have hep c due to liver, currently takes suboxone (not prescribed) wants to begin taking medication prescribed to prevent relapse   History or MRSA - abscess to buttocks in 2008.    Currently vapes - has cut down to 6 mg cartridges.    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  Reports he used to abuse opioids including IV use.  He has been clean 10+ years.  He was on methadone after and tried to stop taking it cold [place].  Now using Suboxone SL which keeps his symptoms at bay.  Requesting referral today for prescription suboxone.  ABDOMINAL PAIN  Duration: years Onset: sudden Severity: severe Quality: doubling over Location:  epigastric  Episode duration: 15-30 minutes for a couple of hours Radiation: no Frequency: once every 3-6 months Alleviating factors: nothing Aggravating factors: hunger Status: stable Treatments attempted: Thinks he was treated for ulcer in the past Fever: no Nausea: no Vomiting: no Weight loss: no Decreased appetite: no Diarrhea: no Constipation: no Blood in stool: no Heartburn: no Jaundice: no Rash: no Dysuria/urinary frequency: no Hematuria: no History of sexually transmitted disease: no Recurrent NSAID use: no  Allergies  Allergen Reactions   Shellfish Allergy Swelling    Outpatient Encounter Medications as of 08/20/2020  Medication Sig Note   cetirizine (ZYRTEC) 10 MG tablet Take 10 mg by mouth daily.    vitamin B-12 (CYANOCOBALAMIN) 1000 MCG tablet Take 1,000 mcg by mouth  daily. 08/18/2017: Not taking   [DISCONTINUED] aspirin EC 325 MG tablet Take 1 tablet (325 mg total) by mouth daily. (Patient not taking: Reported on 08/18/2017)    [DISCONTINUED] docusate sodium (COLACE) 100 MG capsule Take 1 capsule (100 mg total) by mouth 2 (two) times daily. While taking narcotic pain medicine. (Patient not taking: Reported on 08/18/2017)    [DISCONTINUED] ibuprofen (ADVIL,MOTRIN) 600 MG tablet Take 1 tablet (600 mg total) by mouth 3 (three) times daily. (Patient not taking: Reported on 08/18/2017)    [DISCONTINUED] methocarbamol (ROBAXIN) 500 MG tablet Take 1 tablet (500 mg total) by mouth 3 (three) times daily as needed for muscle spasms. (Patient not taking: Reported on 08/18/2017)    [DISCONTINUED] Multiple Vitamin (MULTIVITAMIN WITH MINERALS) TABS tablet Take 1 tablet by mouth daily.    [DISCONTINUED] omeprazole-sodium bicarbonate (ZEGERID) 40-1100 MG capsule Take 1 capsule by mouth daily before breakfast. 08/18/2017: Not taking   [DISCONTINUED] ondansetron (ZOFRAN) 4 MG/2ML SOLN injection Inject 2 mLs (4 mg total) into the vein once. (Patient not taking: Reported on 08/18/2017)    [DISCONTINUED] oxyCODONE 10 MG TABS Take 1-2 tablets (10-20 mg total) by mouth every 4 (four) hours as needed for severe pain or breakthrough pain. (Patient not taking: Reported on 08/18/2017)    [DISCONTINUED] oxyCODONE-acetaminophen (PERCOCET) 10-325 MG tablet Take 1 tablet by mouth every 4 (four) hours as needed for pain. (Patient not taking: Reported on 08/18/2017)    [DISCONTINUED] senna (SENOKOT) 8.6 MG tablet Take 2 tablets (17.2 mg total) by mouth daily. While taking narcotic  pain medicine. (Patient not taking: Reported on 08/18/2017)    No facility-administered encounter medications on file as of 08/20/2020.    Active Ambulatory Problems    Diagnosis Date Noted   Allergic rhinitis 08/20/2020   History of opioid abuse (HCC) 08/21/2020   Resolved Ambulatory Problems    Diagnosis Date Noted    Open ankle fracture 11/13/2014   Past Medical History:  Diagnosis Date   Asthma    GERD (gastroesophageal reflux disease)    Kidney stones     Past Medical History:  Diagnosis Date   Asthma    GERD (gastroesophageal reflux disease)    Kidney stones    Open ankle fracture 11/13/2014    Past Surgical History:  Procedure Laterality Date   I & D EXTREMITY Right 11/13/2014   Procedure:  IRRIGATION AND DEBRIDEMENT RIGHT ANKLE, complex wound closure, placement of Vac, and splinting of right ankle ;  Surgeon: Venita Lick, MD;  Location: [facility];  Service: Orthopedics;  Laterality: Right;   ORIF ANKLE FRACTURE Right 11/23/2014   Procedure: OPEN REDUCTION INTERNAL FIXATION (ORIF) RIGHT  BIMALLEOLAR ANKLE FRACTURE;  Surgeon: Toni Arthurs, MD;  Location: [facility];  Service: Orthopedics;  Laterality: Right;   WISDOM TOOTH EXTRACTION      Social History   Tobacco Use   Smoking status: Every Day    Types: E-cigarettes   Smokeless tobacco: Never  Substance Use Topics   Alcohol use: Not Currently    Alcohol/week: 4.0 standard drinks    Types: 4 Cans of beer per week    Comment: occasional drink   Drug use: No    Family History  Problem Relation Age of Onset   Healthy Mother    Healthy Father     Review of Systems Per HPI unless specifically indicated above     Objective:    BP 138/86   Pulse 73   Temp 98.1 F (36.7 C)   Ht 5\' 11"  (1.803 m)   Wt 180 lb (81.6 kg)   SpO2 97%   BMI 25.10 kg/m   Wt Readings from Last 3 Encounters:  08/20/20 180 lb (81.6 kg)  11/12/14 150 lb (68 kg)    Physical Exam Vitals and nursing note reviewed.  Constitutional:      General: He is not in acute distress.    Appearance: Normal appearance. He is normal weight. He is not toxic-appearing.  HENT:     Head: Normocephalic and atraumatic.  Eyes:     General: No scleral icterus.       Right eye: No discharge.        Left eye: No discharge.     Extraocular Movements: Extraocular movements  intact.     Pupils: Pupils are equal, round, and reactive to light.  Neck:     Vascular: No carotid bruit.  Cardiovascular:     Rate and Rhythm: Normal rate.     Heart sounds: Normal heart sounds. No murmur heard. Pulmonary:     Effort: Pulmonary effort is normal. No respiratory distress.     Breath sounds: Normal breath sounds. No wheezing or rhonchi.  Abdominal:     General: Abdomen is flat. Bowel sounds are normal. There is no distension.     Tenderness: There is no abdominal tenderness.  Musculoskeletal:        General: Normal range of motion.     Cervical back: Normal range of motion.     Right lower leg: No edema.  Left lower leg: No edema.  Skin:    General: Skin is warm and dry.     Capillary Refill: Capillary refill takes less than 2 seconds.     Coloration: Skin is not jaundiced or pale.  Neurological:     General: No focal deficit present.     Mental Status: He is alert and oriented to person, place, and time.     Motor: No weakness.     Gait: Gait normal.  Psychiatric:        Mood and Affect: Mood normal.        Behavior: Behavior normal.        Thought Content: Thought content normal.        Judgment: Judgment normal.      Assessment & Plan:   Problem List Items Addressed This Visit       Respiratory   Allergic rhinitis    Chronic.  Takes over-the-counter cetirizine 10 mg daily which helps keep symptoms at bay.  Continue this medication.       Relevant Orders   COMPLETE METABOLIC PANEL WITH GFR (Completed)   CBC with Differential/Platelet (Completed)     Other   History of opioid abuse (HCC)    Currently taking Suboxone sublingual to control symptoms.  Requesting referral to pain management provider who can prescribe for him.  Referral placed today.  We will screen for hepatitis C virus and HIV today per request.       Relevant Orders   Ambulatory referral to Pain Clinic   Other Visit Diagnoses     Encounter to establish care    -  Primary    Polydipsia       We will check baseline labs including hemoglobin A1c, thyroid, blood counts.  Follow-up pending results.   Relevant Orders   Hemoglobin A1c (Completed)   COMPLETE METABOLIC PANEL WITH GFR (Completed)   CBC with Differential/Platelet (Completed)   Urinalysis, Routine w reflex microscopic (Completed)   TSH (Completed)   Screening examination for STD (sexually transmitted disease)       Relevant Orders   RPR   Trichomonas vaginalis, RNA   C. trachomatis/N. gonorrhoeae RNA (Completed)   Encounter for screening for HIV       Relevant Orders   HIV Antibody (routine testing w rflx) (Completed)   Need for hepatitis C screening test       Relevant Orders   Hepatitis C antibody   Epigastric pain       Acute.  Unclear etiology-history not consistent with an ulcer given recurrence every few months.  We will check baseline labs today and follow-up pending result        Follow up plan: Return for pending lab work.

## 2020-08-21 DIAGNOSIS — F1111 Opioid abuse, in remission: Secondary | ICD-10-CM | POA: Insufficient documentation

## 2020-08-21 LAB — C. TRACHOMATIS/N. GONORRHOEAE RNA
C. trachomatis RNA, TMA: NOT DETECTED
N. gonorrhoeae RNA, TMA: NOT DETECTED

## 2020-08-21 LAB — TRICHOMONAS VAGINALIS RNA, QL,MALES: Trichomonas vaginalis RNA: NOT DETECTED

## 2020-08-21 NOTE — Assessment & Plan Note (Signed)
Chronic.  Takes over-the-counter cetirizine 10 mg daily which helps keep symptoms at bay.  Continue this medication.

## 2020-08-21 NOTE — Assessment & Plan Note (Addendum)
Currently taking Suboxone sublingual to control symptoms.  Requesting referral to pain management provider who can prescribe for him.  Referral placed today.  We will screen for hepatitis C virus and HIV today per request.

## 2020-08-24 LAB — CBC WITH DIFFERENTIAL/PLATELET
Absolute Monocytes: 475 cells/uL (ref 200–950)
Basophils Absolute: 31 cells/uL (ref 0–200)
Basophils Relative: 0.7 %
Eosinophils Absolute: 31 cells/uL (ref 15–500)
Eosinophils Relative: 0.7 %
HCT: 41.3 % (ref 38.5–50.0)
Hemoglobin: 13.9 g/dL (ref 13.2–17.1)
Lymphs Abs: 1219 cells/uL (ref 850–3900)
MCH: 32.3 pg (ref 27.0–33.0)
MCHC: 33.7 g/dL (ref 32.0–36.0)
MCV: 95.8 fL (ref 80.0–100.0)
MPV: 10.2 fL (ref 7.5–12.5)
Monocytes Relative: 10.8 %
Neutro Abs: 2644 cells/uL (ref 1500–7800)
Neutrophils Relative %: 60.1 %
Platelets: 260 10*3/uL (ref 140–400)
RBC: 4.31 10*6/uL (ref 4.20–5.80)
RDW: 11.6 % (ref 11.0–15.0)
Total Lymphocyte: 27.7 %
WBC: 4.4 10*3/uL (ref 3.8–10.8)

## 2020-08-24 LAB — COMPLETE METABOLIC PANEL WITH GFR
AG Ratio: 1.7 (calc) (ref 1.0–2.5)
ALT: 19 U/L (ref 9–46)
AST: 30 U/L (ref 10–40)
Albumin: 4.6 g/dL (ref 3.6–5.1)
Alkaline phosphatase (APISO): 37 U/L (ref 36–130)
BUN: 17 mg/dL (ref 7–25)
CO2: 25 mmol/L (ref 20–32)
Calcium: 9.3 mg/dL (ref 8.6–10.3)
Chloride: 101 mmol/L (ref 98–110)
Creat: 0.85 mg/dL (ref 0.60–1.26)
Globulin: 2.7 g/dL (calc) (ref 1.9–3.7)
Glucose, Bld: 88 mg/dL (ref 65–99)
Potassium: 4.4 mmol/L (ref 3.5–5.3)
Sodium: 138 mmol/L (ref 135–146)
Total Bilirubin: 1.4 mg/dL — ABNORMAL HIGH (ref 0.2–1.2)
Total Protein: 7.3 g/dL (ref 6.1–8.1)
eGFR: 115 mL/min/{1.73_m2} (ref >=60)

## 2020-08-24 LAB — RPR: RPR Ser Ql: NONREACTIVE

## 2020-08-24 LAB — HEPATITIS C RNA QUANTITATIVE
HCV Quantitative Log: 6.4 Log IU/mL — ABNORMAL HIGH
HCV RNA, PCR, QN: 2540000 IU/mL — ABNORMAL HIGH

## 2020-08-24 LAB — HEMOGLOBIN A1C
Hgb A1c MFr Bld: 5.1 % of total Hgb (ref <5.7)
Mean Plasma Glucose: 100 mg/dL
eAG (mmol/L): 5.5 mmol/L

## 2020-08-24 LAB — HEPATITIS C ANTIBODY
Hepatitis C Ab: REACTIVE — AB
SIGNAL TO CUT-OFF: 29.7 — ABNORMAL HIGH (ref <1.00)

## 2020-08-24 LAB — TSH: TSH: 0.71 mIU/L (ref 0.40–4.50)

## 2020-08-24 LAB — HIV ANTIBODY (ROUTINE TESTING W REFLEX): HIV 1&2 Ab, 4th Generation: NONREACTIVE

## 2020-08-25 ENCOUNTER — Other Ambulatory Visit: Payer: Self-pay

## 2020-08-25 DIAGNOSIS — F1120 Opioid dependence, uncomplicated: Secondary | ICD-10-CM | POA: Diagnosis not present

## 2020-08-25 DIAGNOSIS — R319 Hematuria, unspecified: Secondary | ICD-10-CM

## 2020-08-25 NOTE — Progress Notes (Signed)
Spoke with pt regarding lab results, put in order for urine, awaiting call from ID for appt

## 2020-08-25 NOTE — Addendum Note (Signed)
Addended by: Cathlean Marseilles A on: 08/25/2020 12:19 AM   Modules accepted: Orders

## 2020-08-27 ENCOUNTER — Telehealth: Payer: Self-pay | Admitting: Nurse Practitioner

## 2020-08-27 NOTE — Telephone Encounter (Signed)
Received call from [facility] with [facility] (formerly [facility]) for pain management. Fax was incomplete. Order not included. Please resend to [phone]. Please advise at [phone] with questions.

## 2020-08-28 ENCOUNTER — Telehealth: Payer: Self-pay | Admitting: Nurse Practitioner

## 2020-08-28 NOTE — Telephone Encounter (Signed)
Refaxing order with encounter notes

## 2020-08-28 NOTE — Telephone Encounter (Signed)
Kathie Rhodes from [facility] called to verify that do accept pts who are on suboxone and therefore could not accept the referral. If the provider has any questions please feel free to call [phone].

## 2020-08-29 NOTE — Telephone Encounter (Signed)
Sent message to referral coordinator to assist pt with finding pain clinic that will accept pt use of suboxone.

## 2020-09-01 DIAGNOSIS — F1120 Opioid dependence, uncomplicated: Secondary | ICD-10-CM | POA: Diagnosis not present

## 2020-09-05 ENCOUNTER — Other Ambulatory Visit (HOSPITAL_COMMUNITY): Payer: Self-pay

## 2020-09-05 ENCOUNTER — Telehealth: Payer: Self-pay

## 2020-09-05 NOTE — Telephone Encounter (Signed)
[facility] Patient Product/process development scientist completed.    The patient is insured through [employer].  Medication will need a PA.  We will continue to follow to see if copay assistance is needed.  Clearance Coots, CPhT Specialty Pharmacy Patient [facility] for [facility] Phone: [phone] Fax:  [phone]

## 2020-09-08 DIAGNOSIS — F1120 Opioid dependence, uncomplicated: Secondary | ICD-10-CM | POA: Diagnosis not present

## 2020-09-10 ENCOUNTER — Ambulatory Visit (INDEPENDENT_AMBULATORY_CARE_PROVIDER_SITE_OTHER): Payer: BC Managed Care – PPO | Admitting: Internal Medicine

## 2020-09-10 ENCOUNTER — Encounter: Payer: Self-pay | Admitting: Internal Medicine

## 2020-09-10 ENCOUNTER — Other Ambulatory Visit: Payer: Self-pay

## 2020-09-10 DIAGNOSIS — B182 Chronic viral hepatitis C: Secondary | ICD-10-CM

## 2020-09-10 NOTE — Progress Notes (Signed)
[facility]      Reason for Consult:chronic hepatitis C    Referring Physician: Phil Dopp NP    Patient ID: John Lozano, male    DOB: [birth year], 40 y.o.   MRN: [id number]  HPI:   He is here for treatment consideration for chronic hepatitis C. He has a history of IVDA and has been drug free now over 10 years.  He was tested 10 years ago for hepatitis C and was a low level but never retested until he recently established with a new PCP. He is antibody and HCV viral RNA positive.  HIV negative.  He is interested in treatment.  Does not take any ppi.  On Buporenorphine and doing well.  Previously was on methadone.  No known liver disease.    Past Medical History:  Diagnosis Date   Asthma    GERD (gastroesophageal reflux disease)    Kidney stones    Open ankle fracture 11/13/2014    Prior to Admission medications   Medication Sig Start Date End Date Taking? Authorizing Provider  Buprenorphine HCl-Naloxone HCl 8-2 MG FILM Place under the tongue 2 (two) times daily. 09/01/20  Yes [provider]  cetirizine (ZYRTEC) 10 MG tablet Take 10 mg by mouth daily.   Yes [provider]  clonazePAM (KLONOPIN) 1 MG tablet Take 1 mg by mouth 2 (two) times daily.   Yes [provider]  vitamin B-12 (CYANOCOBALAMIN) 1000 MCG tablet Take 1,000 mcg by mouth daily. Patient not taking: Reported on 09/10/2020    [provider]    Allergies  Allergen Reactions   Shellfish Allergy Swelling    Social History   Tobacco Use   Smoking status: Every Day    Types: E-cigarettes   Smokeless tobacco: Never  Substance Use Topics   Alcohol use: Not Currently    Alcohol/week: 4.0 standard drinks    Types: 4 Cans of beer per week    Comment: occasional drink   Drug use: Yes    Types: Benzodiazepines    Comment: clonazepam    Family History  Problem Relation Age of Onset   Healthy Mother    Healthy Father     Review of  Systems  Constitutional: negative for fevers and chills Gastrointestinal: negative for nausea and diarrhea Integument/breast: negative for rash All other systems reviewed and are negative    Constitutional: in no apparent distress  Vitals:   09/10/20 0956  BP: 128/83  Pulse: 69  Temp: 98.2 F (36.8 C)  SpO2: 97%   EYES: anicteric ENMT: no thrush Cardiovascular: Cor RRR Respiratory: clear Musculoskeletal: no pedal edema noted Skin: negatives: no rash Neuro: non-focal  Labs: Lab Results  Component Value Date   WBC 4.4 08/20/2020   HGB 13.9 08/20/2020   HCT 41.3 08/20/2020   MCV 95.8 08/20/2020   PLT 260 08/20/2020    Lab Results  Component Value Date   CREATININE 0.85 08/20/2020   BUN 17 08/20/2020   NA 138 08/20/2020   K 4.4 08/20/2020   CL 101 08/20/2020   CO2 25 08/20/2020    Lab Results  Component Value Date   ALT 19 08/20/2020   AST 30 08/20/2020   ALKPHOS 44 02/07/2007   BILITOT 1.4 (H) 08/20/2020     Assessment: chronic hepatitis C.  I discussed the hepatitis C, treatment options, risk factors of untreated hepatitis C including cirrhosis and hepatocellular carcinoma.  He is interested in treatment.   Plan:  1)  Labs - genotype, hepatitis B labs, fibrosure 2) ultrasound of the liver 3) will prescribe once results return Follow up in 4-5 weeks on treatment.

## 2020-09-15 DIAGNOSIS — F1120 Opioid dependence, uncomplicated: Secondary | ICD-10-CM | POA: Diagnosis not present

## 2020-09-15 LAB — LIVER FIBROSIS, FIBROTEST-ACTITEST
ALT: 18 U/L (ref 9–46)
Alpha-2-Macroglobulin: 140 mg/dL (ref 106–279)
Apolipoprotein A1: 176 mg/dL (ref 94–176)
Bilirubin: 1.7 mg/dL — ABNORMAL HIGH (ref 0.2–1.2)
Fibrosis Score: 0.43
GGT: 18 U/L (ref 3–90)
Haptoglobin: <8 mg/dL — ABNORMAL LOW (ref 43–212)
Necroinflammat ACT Score: 0.08
Reference ID: 3974217

## 2020-09-15 LAB — HEPATITIS C GENOTYPE: HCV Genotype: 2

## 2020-09-15 LAB — HEPATITIS B CORE ANTIBODY, TOTAL: Hep B Core Total Ab: NONREACTIVE

## 2020-09-15 LAB — HEPATITIS B SURFACE ANTIGEN: Hepatitis B Surface Ag: NONREACTIVE

## 2020-09-15 LAB — HEPATITIS A ANTIBODY, TOTAL: Hepatitis A AB,Total: NONREACTIVE

## 2020-09-15 LAB — HEPATITIS B SURFACE ANTIBODY,QUALITATIVE: Hep B S Ab: REACTIVE — AB

## 2020-09-16 ENCOUNTER — Other Ambulatory Visit: Payer: Self-pay | Admitting: Internal Medicine

## 2020-09-16 ENCOUNTER — Telehealth: Payer: Self-pay

## 2020-09-16 ENCOUNTER — Other Ambulatory Visit (HOSPITAL_COMMUNITY): Payer: Self-pay

## 2020-09-16 MED ORDER — SOFOSBUVIR-VELPATASVIR 400-100 MG PO TABS
1.0000 | ORAL_TABLET | Freq: Every day | ORAL | 2 refills | Status: DC
  Filled 2020-09-16 – 2020-09-24 (×4): qty 28, 28d supply, fill #0
  Filled 2020-10-23: qty 28, 28d supply, fill #1
  Filled 2020-11-12: qty 28, 28d supply, fill #2

## 2020-09-16 NOTE — Progress Notes (Signed)
Epclusa sent

## 2020-09-16 NOTE — Telephone Encounter (Signed)
[facility] Patient Advocate Encounter   Received notification from [facility] that prior authorization for Dorita Fray is required.   PA submitted on 09/16/20 Key [id number] Status is pending    [facility] Clinic will continue to follow.   Clearance Coots, CPhT Specialty Pharmacy Patient [facility] for Infectious Disease Phone: [phone] Fax:  [phone]

## 2020-09-22 ENCOUNTER — Other Ambulatory Visit: Payer: Self-pay

## 2020-09-22 ENCOUNTER — Other Ambulatory Visit: Payer: BC Managed Care – PPO

## 2020-09-22 DIAGNOSIS — R319 Hematuria, unspecified: Secondary | ICD-10-CM

## 2020-09-22 LAB — URINALYSIS, ROUTINE W REFLEX MICROSCOPIC
Bilirubin Urine: NEGATIVE
Glucose, UA: NEGATIVE
Hgb urine dipstick: NEGATIVE
Ketones, ur: NEGATIVE
Leukocytes,Ua: NEGATIVE
Nitrite: NEGATIVE
Protein, ur: NEGATIVE
Specific Gravity, Urine: 1.02 (ref 1.001–1.035)
pH: 7 (ref 5.0–8.0)

## 2020-09-23 ENCOUNTER — Other Ambulatory Visit (HOSPITAL_COMMUNITY): Payer: Self-pay

## 2020-09-23 ENCOUNTER — Telehealth: Payer: Self-pay

## 2020-09-23 NOTE — Telephone Encounter (Signed)
[facility] Patient Advocate Encounter  Prior Authorization for Dorita Fray has been approved.    PA# [id number] Effective dates: 09/16/20 through 04/04/21  Patients co-pay is $7198.00.   I was able to get patient a copay card through [employer], however they will need to do a benefit investigation. Because patient copay is too high.     [facility] Clinic will continue to follow.  Clearance Coots, CPhT Specialty Pharmacy Patient [facility] for Infectious Disease Phone: [phone] Fax:  [phone]

## 2020-09-24 ENCOUNTER — Other Ambulatory Visit (HOSPITAL_COMMUNITY): Payer: Self-pay

## 2020-09-24 ENCOUNTER — Telehealth: Payer: Self-pay

## 2020-09-24 NOTE — Telephone Encounter (Signed)
[facility] Patient Advocate Encounter   I was successful in securing patient an $5600.00 grant from Patient [facility]) to provide copayment coverage for Epclusa.  This will make the out of pocket cost $0.00.     I have spoken with the patient.    The billing information is as follows and has been shared with [facility].         Dates of Eligibility: 005/19/22 through 09/23/21  Patient knows to call the office with questions or concerns.  Clearance Coots, CPhT Specialty Pharmacy Patient [facility] for Infectious Disease Phone: [phone] Fax:  [phone]

## 2020-09-29 ENCOUNTER — Ambulatory Visit
Admission: RE | Admit: 2020-09-29 | Discharge: 2020-09-29 | Disposition: A | Discharge status: Home / Self Care | Payer: BC Managed Care – PPO | Source: Ambulatory Visit | Attending: Internal Medicine | Admitting: Internal Medicine

## 2020-09-29 DIAGNOSIS — F1120 Opioid dependence, uncomplicated: Secondary | ICD-10-CM | POA: Diagnosis not present

## 2020-09-29 DIAGNOSIS — B1920 Unspecified viral hepatitis C without hepatic coma: Secondary | ICD-10-CM | POA: Diagnosis not present

## 2020-09-29 DIAGNOSIS — N200 Calculus of kidney: Secondary | ICD-10-CM | POA: Diagnosis not present

## 2020-09-29 DIAGNOSIS — B182 Chronic viral hepatitis C: Secondary | ICD-10-CM

## 2020-09-30 ENCOUNTER — Telehealth: Payer: Self-pay

## 2020-09-30 NOTE — Telephone Encounter (Signed)
[facility] called to report findings from [place]. Forwarding to provider to review.   Mazzy Santarelli Loyola Mast, RN

## 2020-10-14 DIAGNOSIS — F1120 Opioid dependence, uncomplicated: Secondary | ICD-10-CM | POA: Diagnosis not present

## 2020-10-16 ENCOUNTER — Other Ambulatory Visit (HOSPITAL_COMMUNITY): Payer: Self-pay

## 2020-10-17 ENCOUNTER — Ambulatory Visit: Payer: BC Managed Care – PPO | Admitting: Internal Medicine

## 2020-10-23 ENCOUNTER — Other Ambulatory Visit (HOSPITAL_COMMUNITY): Payer: Self-pay

## 2020-10-28 DIAGNOSIS — F1120 Opioid dependence, uncomplicated: Secondary | ICD-10-CM | POA: Diagnosis not present

## 2020-10-30 ENCOUNTER — Other Ambulatory Visit: Payer: Self-pay

## 2020-10-30 ENCOUNTER — Ambulatory Visit (INDEPENDENT_AMBULATORY_CARE_PROVIDER_SITE_OTHER): Payer: BC Managed Care – PPO | Admitting: Internal Medicine

## 2020-10-30 ENCOUNTER — Encounter: Payer: Self-pay | Admitting: Internal Medicine

## 2020-10-30 VITALS — BP 121/79 | HR 73 | Temp 98.0°F | Wt 179.0 lb

## 2020-10-30 DIAGNOSIS — B182 Chronic viral hepatitis C: Secondary | ICD-10-CM

## 2020-10-30 DIAGNOSIS — F1111 Opioid abuse, in remission: Secondary | ICD-10-CM

## 2020-10-30 DIAGNOSIS — Z23 Encounter for immunization: Secondary | ICD-10-CM | POA: Diagnosis not present

## 2020-10-30 NOTE — Progress Notes (Signed)
   Subjective:    Patient ID: John Lozano, male    DOB: Oct 03, 1980, 40 y.o.   MRN: [id number]  HPI Here for follow up of chronic hepatitis C He has genotype 2 infection with a viral load of 2.5 million and has started on Epclusa for 12 weeks and now completing his first month.  Some mild headache and some initial fatigue.  Ultrasound of the liver with no cirrhosis and F1-2 on Fibrosure.  There was noted to be a kidney stone on the ultrasound with some obstruction.  He has had these previously.    Review of Systems  Constitutional:  Negative for fever.  Gastrointestinal:  Negative for diarrhea and nausea.  Skin:  Negative for rash.      Objective:   Physical Exam Eyes:     General: No scleral icterus. Pulmonary:     Effort: Pulmonary effort is normal.  Neurological:     General: No focal deficit present.     Mental Status: He is alert.  Psychiatric:        Mood and Affect: Mood normal.   SH: + tobacco      Assessment & Plan:

## 2020-10-30 NOTE — Assessment & Plan Note (Signed)
Hepatitis A non-immune.  I discussed vaccination and he is going to check with his insurance on the cost.  I discussed the risks, foodborne/fecal oral.

## 2020-10-30 NOTE — Assessment & Plan Note (Signed)
He is doing well on treatment and will continue for the 12 weeks.  Will check the viral load today and if ok, he can return in about 6 months for SVR12.

## 2020-10-30 NOTE — Assessment & Plan Note (Signed)
Continues to remain drug free 

## 2020-11-01 LAB — COMPLETE METABOLIC PANEL WITH GFR
AG Ratio: 1.6 (calc) (ref 1.0–2.5)
ALT: 15 U/L (ref 9–46)
AST: 22 U/L (ref 10–40)
Albumin: 4.6 g/dL (ref 3.6–5.1)
Alkaline phosphatase (APISO): 42 U/L (ref 36–130)
BUN: 19 mg/dL (ref 7–25)
CO2: 29 mmol/L (ref 20–32)
Calcium: 9.9 mg/dL (ref 8.6–10.3)
Chloride: 103 mmol/L (ref 98–110)
Creat: 0.95 mg/dL (ref 0.60–1.26)
Globulin: 2.8 g/dL (calc) (ref 1.9–3.7)
Glucose, Bld: 98 mg/dL (ref 65–99)
Potassium: 4.4 mmol/L (ref 3.5–5.3)
Sodium: 140 mmol/L (ref 135–146)
Total Bilirubin: 0.8 mg/dL (ref 0.2–1.2)
Total Protein: 7.4 g/dL (ref 6.1–8.1)
eGFR: 105 mL/min/{1.73_m2} (ref >=60)

## 2020-11-01 LAB — HEPATITIS C RNA QUANTITATIVE
HCV Quantitative Log: <1.18 log IU/mL
HCV RNA, PCR, QN: <15 IU/mL

## 2020-11-12 ENCOUNTER — Other Ambulatory Visit (HOSPITAL_COMMUNITY): Payer: Self-pay

## 2020-11-20 ENCOUNTER — Other Ambulatory Visit (HOSPITAL_COMMUNITY): Payer: Self-pay

## 2020-11-26 DIAGNOSIS — F1120 Opioid dependence, uncomplicated: Secondary | ICD-10-CM | POA: Diagnosis not present

## 2020-12-01 ENCOUNTER — Telehealth: Payer: Self-pay | Admitting: Nurse Practitioner

## 2020-12-01 ENCOUNTER — Ambulatory Visit (INDEPENDENT_AMBULATORY_CARE_PROVIDER_SITE_OTHER): Payer: BC Managed Care – PPO | Admitting: Nurse Practitioner

## 2020-12-01 ENCOUNTER — Other Ambulatory Visit: Payer: Self-pay

## 2020-12-01 ENCOUNTER — Encounter: Payer: Self-pay | Admitting: Nurse Practitioner

## 2020-12-01 DIAGNOSIS — J029 Acute pharyngitis, unspecified: Secondary | ICD-10-CM

## 2020-12-01 NOTE — Telephone Encounter (Signed)
Patient received bill for $395. When patient spoke with [facility] billinglast week, patient told wrong code filed. Bill resubmitted to [employer]. Awaiting resolution.

## 2020-12-01 NOTE — Progress Notes (Signed)
Subjective:    Patient ID: John Lozano, male    DOB: 23-Jul-1980, 40 y.o.   MRN: [id number]  HPI: John Lozano is a 40 y.o. male presenting virtually for   Chief Complaint  Patient presents with   Sore Throat   UPPER RESPIRATORY TRACT INFECTION Onset: Thursday Fever: no Cough: no Shortness of breath: no Wheezing: no Chest pain: no Chest tightness: no Chest congestion: no Nasal congestion: yes Runny nose: yes Post nasal drip: yes Sneezing: no Sore throat: yes Patches in mouth: no Swollen glands: yes Sinus pressure: no Headache: no Face pain: no Toothache: yes; gums around teeth; reports historically bad teeth and cracked/pulled teeth Ear pain: no  Ear pressure: no  Eyes red/itching:no Eye drainage/crusting: no  Nausea: yes  Vomiting: no Diarrhea: yes  Change in appetite: yes  Loss of taste/smell: no  Rash: no Fatigue: yes Sick contacts: no Strep contacts: no  Context: better Recurrent sinusitis: no Treatments attempted: warm water with honey, mouth rinses Relief with OTC medications: yes   Allergies  Allergen Reactions   Shellfish Allergy Swelling    Outpatient Encounter Medications as of 12/01/2020  Medication Sig Note   Buprenorphine HCl-Naloxone HCl 8-2 MG FILM Place under the tongue 2 (two) times daily.    cetirizine (ZYRTEC) 10 MG tablet Take 10 mg by mouth daily.    clonazePAM (KLONOPIN) 1 MG tablet Take 1 mg by mouth 2 (two) times daily.    Sofosbuvir-Velpatasvir (EPCLUSA) 400-100 MG TABS Take 1 tablet by mouth daily.    vitamin B-12 (CYANOCOBALAMIN) 1000 MCG tablet Take 1,000 mcg by mouth daily. 08/18/2017: Not taking   No facility-administered encounter medications on file as of 12/01/2020.    Patient Active Problem List   Diagnosis Date Noted   Need for prophylactic vaccination and inoculation against viral hepatitis 10/30/2020   Chronic hepatitis C without hepatic coma (HCC) 09/10/2020   History of opioid abuse (HCC)  08/21/2020   Allergic rhinitis 08/20/2020    Past Medical History:  Diagnosis Date   Asthma    GERD (gastroesophageal reflux disease)    Kidney stones    Open ankle fracture 11/13/2014    Relevant past medical, surgical, family and social history reviewed and updated as indicated. Interim medical history since our last visit reviewed.  Review of Systems Per HPI unless specifically indicated above     Objective:    There were no vitals taken for this visit.  Wt Readings from Last 3 Encounters:  10/30/20 179 lb (81.2 kg)  09/10/20 180 lb (81.6 kg)  08/20/20 180 lb (81.6 kg)    Physical Exam Physical examination unable to be performed due to lack of equipment.  Patient talking in complete sentences during telemedicine visit.     Assessment & Plan:  1. Sore throat Acute.  Check strep culture and respiratory viral panel.  Follow up pending results. Discussed expected course and features suggestive of secondary bacterial infection.  Continue supportive care. Increase fluid intake with water or electrolyte solution like pedialyte. Encouraged acetaminophen as needed for fever/pain. Encouraged salt water gargling, chloraseptic spray and throat lozenges. Encouraged saline sinus flushes and/or neti with humidified air. Follow up with no improvement in symptoms.   - SARS-CoV-2 RNA (COVID-19) and Respiratory Viral Panel, Qualitative NAAT - STREP GROUP A AG, W/REFLEX TO CULT     Follow up plan: Return if symptoms worsen or fail to improve.   This visit was completed via telephone due to the restrictions of the  COVID-19 pandemic. All issues as above were discussed and addressed but no physical exam was performed. If it was felt that the patient should be evaluated in the office, they were directed there. The patient verbally consented to this visit. Patient was unable to complete an audio/visual visit due to Lack of equipment. Location of the patient: home Location of the provider:  work Those involved with this call:  Provider: Cathlean Marseilles, DNP, FNP-C CMA: n/a Front Desk/Registration: Percival Spanish  Time spent on call:  11 minutes on the phone discussing health concerns. 15 minutes total spent in review of patient's record and preparation of their chart. I verified patient identity using two factors (patient name and date of birth). Patient consents verbally to being seen via telemedicine visit today.

## 2020-12-02 LAB — SARS-COVID-2 RNA(COVID19)AND INFLUENZA A&B, QUALITATIVE NAAT
FLU A: NOT DETECTED
FLU B: NOT DETECTED
SARS CoV2 RNA: NOT DETECTED

## 2020-12-03 LAB — CULTURE, GROUP A STREP
MICRO NUMBER:: 12541753
SPECIMEN QUALITY:: ADEQUATE

## 2020-12-03 LAB — STREP GROUP A AG, W/REFLEX TO CULT: Streptococcus Group A AG: NOT DETECTED

## 2020-12-08 ENCOUNTER — Other Ambulatory Visit (HOSPITAL_COMMUNITY): Payer: Self-pay

## 2020-12-15 NOTE — Telephone Encounter (Signed)
Per Bjorn Loser:   On this one, the same issue here in regards to the Z76.89. You are correct, that claim had not been corrected yet. I went ahead and corrected it so it should bill out tonight. Please let the patient know to ignore the bill he received.   Patient is aware of what Bjorn Loser advised. He verbalized understanding and was happy that the bill would be resubmitted.

## 2020-12-24 DIAGNOSIS — F1120 Opioid dependence, uncomplicated: Secondary | ICD-10-CM | POA: Diagnosis not present

## 2021-01-14 ENCOUNTER — Ambulatory Visit (INDEPENDENT_AMBULATORY_CARE_PROVIDER_SITE_OTHER): Payer: BC Managed Care – PPO | Admitting: Nurse Practitioner

## 2021-01-14 ENCOUNTER — Encounter: Payer: Self-pay | Admitting: Nurse Practitioner

## 2021-01-14 ENCOUNTER — Other Ambulatory Visit: Payer: Self-pay

## 2021-01-14 VITALS — BP 124/88 | HR 93 | Ht 70.0 in | Wt 183.8 lb

## 2021-01-14 DIAGNOSIS — N529 Male erectile dysfunction, unspecified: Secondary | ICD-10-CM | POA: Diagnosis not present

## 2021-01-14 NOTE — Progress Notes (Signed)
Subjective:    Patient ID: John Lozano, male    DOB: Sep 06, 1980, 40 y.o.   MRN: [id number]  HPI: John Lozano is a 40 y.o. male presenting for erectile dysfunction.  Chief Complaint  Patient presents with   Follow-up   Erectile Dysfunction    Erectile dys comes and goes    ERECTILE DYSFUNCTION Patient reports struggling with both maintaining and obtaining an erection at times.  Reports this is frustrating his girlfriend and is affecting relationship. Duration: years Struggles with maintaining or obtaining erection: yes; both History of CV disease/hypertension: no History of diabetes: no History of HLD: no Smoking: uses vape with nicotine - working on quitting Surgery (prostatectomy) or radiotherapy to pelvis: no History of spinal cord/brain injury: no History of Stroke: no Thyroid disease: no Medication (antihypertensives, antidepressants, antipsychotics, antiandrogens, recreational drugs, alcohol): yes; takes suboxone seeing Dr. Tollie Eth, also taking clonazepam 1 mg twice daily Anxiety related to performance, trauma in past, relationship problems, anxiety/depression, stress: does not feel that stress or mood is a factor Fevers: no Hematuria: no; does have history of kidney stones Waking up at night to urinate: once Urinary incontinence: no Chest pain: no Shortness of breath: no  IPSS Questionnaire (AUA-7): Over the past month.   1)  How often have you had a sensation of not emptying your bladder completely after you finish urinating?  0 - Not at all  2)  How often have you had to urinate again less than two hours after you finished urinating? 0 - Not at all  3)  How often have you found you stopped and started again several times when you urinated?  0 - Not at all  4) How difficult have you found it to postpone urination?  0 - Not at all  5) How often have you had a weak urinary stream?  0 - Not at all  6) How often have you had to push or strain to begin  urination?  0 - Not at all  7) How many times did you most typically get up to urinate from the time you went to bed until the time you got up in the morning?  1 - 1 time  Total score:  0-7 mildly symptomatic   8-19 moderately symptomatic   20-35 severely symptomatic    Allergies  Allergen Reactions   Shellfish Allergy Swelling    Outpatient Encounter Medications as of 01/14/2021  Medication Sig Note   Buprenorphine HCl-Naloxone HCl 8-2 MG FILM Place under the tongue 2 (two) times daily.    cetirizine (ZYRTEC) 10 MG tablet Take 10 mg by mouth daily.    clonazePAM (KLONOPIN) 1 MG tablet Take 1 mg by mouth 2 (two) times daily.    Sofosbuvir-Velpatasvir (EPCLUSA) 400-100 MG TABS Take 1 tablet by mouth daily.    vitamin B-12 (CYANOCOBALAMIN) 1000 MCG tablet Take 1,000 mcg by mouth daily. (Patient not taking: Reported on 01/14/2021) 08/18/2017: Not taking   No facility-administered encounter medications on file as of 01/14/2021.    Patient Active Problem List   Diagnosis Date Noted   Erectile dysfunction 01/14/2021   Need for prophylactic vaccination and inoculation against viral hepatitis 10/30/2020   Chronic hepatitis C without hepatic coma (HCC) 09/10/2020   History of opioid abuse (HCC) 08/21/2020   Allergic rhinitis 08/20/2020    Past Medical History:  Diagnosis Date   Asthma    GERD (gastroesophageal reflux disease)    Kidney stones    Open ankle fracture  11/13/2014    Relevant past medical, surgical, family and social history reviewed and updated as indicated. Interim medical history since our last visit reviewed.  Review of Systems Per HPI unless specifically indicated above     Objective:    BP 124/88   Pulse 93   Ht 5\' 10"  (1.778 m)   Wt 183 lb 12.8 oz (83.4 kg)   SpO2 97%   BMI 26.37 kg/m   Wt Readings from Last 3 Encounters:  01/14/21 183 lb 12.8 oz (83.4 kg)  10/30/20 179 lb (81.2 kg)  09/10/20 180 lb (81.6 kg)    Physical Exam Vitals and nursing note  reviewed.  Constitutional:      General: He is not in acute distress.    Appearance: Normal appearance. He is not toxic-appearing.  Eyes:     General: No scleral icterus. Genitourinary:    Comments: Deferred using shared decision making Skin:    General: Skin is warm and dry.     Coloration: Skin is not jaundiced or pale.     Findings: No erythema.  Neurological:     Mental Status: He is alert and oriented to person, place, and time.     Motor: No weakness.     Gait: Gait normal.  Psychiatric:        Mood and Affect: Mood normal.        Behavior: Behavior normal.        Thought Content: Thought content normal.        Judgment: Judgment normal.      Assessment & Plan:   Problem List Items Addressed This Visit       Other   Erectile dysfunction - Primary    Chronic, ongoing.  Discussed medications - suboxone/clonazepam could be playing a role in erectile dysfunction.  Discussed hormonal causes - patient is interested in this.  Will refer to endocrinology for hormone testing.  Other risk factors negative.  Can consider starting on tadalafil vs. Sildenafil if hormone testing normal.  Follow up pending work up.      Relevant Orders   Ambulatory referral to Endocrinology     Follow up plan: Return if symptoms worsen or fail to improve.

## 2021-01-14 NOTE — Assessment & Plan Note (Signed)
Chronic, ongoing.  Discussed medications - suboxone/clonazepam could be playing a role in erectile dysfunction.  Discussed hormonal causes - patient is interested in this.  Will refer to endocrinology for hormone testing.  Other risk factors negative.  Can consider starting on tadalafil vs. Sildenafil if hormone testing normal.  Follow up pending work up.

## 2021-01-21 DIAGNOSIS — F1120 Opioid dependence, uncomplicated: Secondary | ICD-10-CM | POA: Diagnosis not present

## 2021-03-12 ENCOUNTER — Telehealth: Payer: Self-pay

## 2021-03-12 DIAGNOSIS — N529 Male erectile dysfunction, unspecified: Secondary | ICD-10-CM

## 2021-03-12 NOTE — Telephone Encounter (Signed)
Pt called to report Endo has not gotten to him and is refusing to schedule him until they get to his referral in the [facility]. They have advised him they are very delayed in scheduling. Pt states he cannot wait a few more months for them to get caught up. Pt would like to know if you could send referral to any other office, maybe urology, as his personal relationship is suffering from his physical issues.   Please advise, thanks!

## 2021-03-12 NOTE — Telephone Encounter (Signed)
Okay to send referral to Urology? 

## 2021-03-12 NOTE — Telephone Encounter (Signed)
Referral to Urology placed. Pt aware.

## 2021-04-29 ENCOUNTER — Ambulatory Visit: Payer: BC Managed Care – PPO | Admitting: Internal Medicine

## 2021-05-06 ENCOUNTER — Ambulatory Visit: Payer: BC Managed Care – PPO | Admitting: Internal Medicine

## 2021-09-11 ENCOUNTER — Ambulatory Visit (HOSPITAL_COMMUNITY)
Admission: EM | Admit: 2021-09-11 | Discharge: 2021-09-11 | Disposition: A | Discharge status: Home / Self Care | Payer: Commercial Managed Care - HMO | Attending: Urgent Care | Admitting: Urgent Care

## 2021-09-11 ENCOUNTER — Encounter (HOSPITAL_COMMUNITY): Payer: Self-pay

## 2021-09-11 DIAGNOSIS — R030 Elevated blood-pressure reading, without diagnosis of hypertension: Secondary | ICD-10-CM

## 2021-09-11 DIAGNOSIS — L237 Allergic contact dermatitis due to plants, except food: Secondary | ICD-10-CM

## 2021-09-11 MED ORDER — MUPIROCIN 2 % EX OINT: 1.0000 | TOPICAL_OINTMENT | Freq: Two times a day (BID) | CUTANEOUS | 0 refills | Status: DC

## 2021-09-11 MED ORDER — DEXAMETHASONE SODIUM PHOSPHATE 10 MG/ML IJ SOLN
INTRAMUSCULAR | Status: AC
  Filled 2021-09-11: qty 1

## 2021-09-11 MED ORDER — PREDNISONE 10 MG (21) PO TBPK: ORAL_TABLET | Freq: Every day | ORAL | 0 refills | Status: DC

## 2021-09-11 MED ORDER — DEXAMETHASONE SODIUM PHOSPHATE 10 MG/ML IJ SOLN
10.0000 mg | Freq: Once | INTRAMUSCULAR | Status: AC
  Administered 2021-09-11: 10 mg via INTRAMUSCULAR

## 2021-09-11 NOTE — Discharge Instructions (Addendum)
Your rash is due to poison ivy.  Please avoid itching your rash as this can cause secondary bacterial infections in severe cases. You can continue topical steroid cream, but for no longer than 14 days consecutively. Avoid use on the face. Start taking the prednisone taper as directed. Best to take in the mornings to prevent insomnia. You may also take 25-50mg  benadryl to help with the itching and to offset any sleep issues on the steroids. Apply topical mupirocin to the bullae that are crusting. Avoid alcohol wipes or topical sprays as this is drying and can make it more itchy. Follow up if rash worsens, or systemic symptoms develop including wheezing.   Monitor BP at home, goal reading 120/80. Establish care with PCP, scan QR code at checkout to obtain new provider.

## 2021-09-11 NOTE — ED Provider Notes (Signed)
[facility]    CSN: [id number] Arrival date & time: 09/11/21  1730      History   Chief Complaint Chief Complaint  Patient presents with   Poison Ivy    HPI John Lozano is a 41 y.o. male.   Pleasant 41 year old male presents today with a painful itchy rash from poison ivy.  Has a history of this in the past.  States he is highly allergic to these plants.  Rash started yesterday and is gotten worse.  It is primarily to his bilateral lower extremities, but extends up to his groin and also affects his bilateral arms.  He has been using over-the-counter calamine lotion without relief.  He states it is highly pruritic.  Several of the bulla are draining clear fluid.  Denies any erythema of the skin, fever, or additional systemic symptoms.   Poison Ivy    Past Medical History:  Diagnosis Date   Asthma    GERD (gastroesophageal reflux disease)    Kidney stones    Open ankle fracture 11/13/2014    Patient Active Problem List   Diagnosis Date Noted   Erectile dysfunction 01/14/2021   Need for prophylactic vaccination and inoculation against viral hepatitis 10/30/2020   Chronic hepatitis C without hepatic coma (HCC) 09/10/2020   History of opioid abuse (HCC) 08/21/2020   Allergic rhinitis 08/20/2020    Past Surgical History:  Procedure Laterality Date   I & D EXTREMITY Right 11/13/2014   Procedure:  IRRIGATION AND DEBRIDEMENT RIGHT ANKLE, complex wound closure, placement of Vac, and splinting of right ankle ;  Surgeon: Venita Lick, MD;  Location: [facility];  Service: Orthopedics;  Laterality: Right;   ORIF ANKLE FRACTURE Right 11/23/2014   Procedure: OPEN REDUCTION INTERNAL FIXATION (ORIF) RIGHT  BIMALLEOLAR ANKLE FRACTURE;  Surgeon: Toni Arthurs, MD;  Location: [facility];  Service: Orthopedics;  Laterality: Right;   WISDOM TOOTH EXTRACTION         Home Medications    Prior to Admission medications   Medication Sig Start Date End Date Taking? Authorizing  Provider  mupirocin ointment (BACTROBAN) 2 % Apply 1 Application topically 2 (two) times daily. 09/11/21  Yes Rayven Hendrickson L, PA  predniSONE (STERAPRED UNI-PAK 21 TAB) 10 MG (21) TBPK tablet Take by mouth daily. Take 6 tabs by mouth daily  for 2 days, then 5 tabs for 2 days, then 4 tabs for 2 days, then 3 tabs for 2 days, 2 tabs for 2 days, then 1 tab by mouth daily for 2 days 09/11/21  Yes Deona Novitski L, PA  Buprenorphine HCl-Naloxone HCl 8-2 MG FILM Place under the tongue 2 (two) times daily. 09/01/20   [provider]  cetirizine (ZYRTEC) 10 MG tablet Take 10 mg by mouth daily.    [provider]  clonazePAM (KLONOPIN) 1 MG tablet Take 1 mg by mouth 2 (two) times daily.    [provider]  Sofosbuvir-Velpatasvir (EPCLUSA) 400-100 MG TABS Take 1 tablet by mouth daily. 09/16/20   Comer, Belia Heman, MD    Family History Family History  Problem Relation Age of Onset   Healthy Mother    Healthy Father     Social History Social History   Tobacco Use   Smoking status: Every Day    Types: E-cigarettes   Smokeless tobacco: Never  Substance Use Topics   Alcohol use: Not Currently    Alcohol/week: 4.0 standard drinks of alcohol    Types: 4 Cans of beer per week  Comment: occasional drink   Drug use: Not Currently    Types: Benzodiazepines    Comment: clonazepam     Allergies   Shellfish allergy   Review of Systems Review of Systems As per HPI  Physical Exam Triage Vital Signs ED Triage Vitals [09/11/21 1817]  Enc Vitals Group     BP (!) 177/93     Pulse Rate 64     Resp 18     Temp 98.4 F (36.9 C)     Temp Source Oral     SpO2 99 %     Weight      Height      Head Circumference      Peak Flow      Pain Score 5     Pain Loc      Pain Edu?      Excl. in GC?    No data found.  Updated Vital Signs BP (!) 177/93 (BP Location: Left Arm)   Pulse 64   Temp 98.4 F (36.9 C) (Oral)   Resp 18   SpO2 99%   Visual Acuity Right Eye  Distance:   Left Eye Distance:   Bilateral Distance:    Right Eye Near:   Left Eye Near:    Bilateral Near:     Physical Exam Vitals and nursing note reviewed.  Constitutional:      General: He is not in acute distress.    Appearance: Normal appearance. He is normal weight. He is not ill-appearing, toxic-appearing or diaphoretic.  HENT:     Head: Normocephalic and atraumatic.     Right Ear: External ear normal.     Left Ear: External ear normal.     Mouth/Throat:     Mouth: Mucous membranes are moist.  Eyes:     General: No scleral icterus.    Extraocular Movements: Extraocular movements intact.     Conjunctiva/sclera: Conjunctivae normal.     Pupils: Pupils are equal, round, and reactive to light.  Cardiovascular:     Rate and Rhythm: Normal rate.  Pulmonary:     Effort: Pulmonary effort is normal. No respiratory distress.  Musculoskeletal:        General: No swelling. Normal range of motion.     Cervical back: Normal range of motion and neck supple. No rigidity or tenderness.  Lymphadenopathy:     Cervical: No cervical adenopathy.  Skin:    General: Skin is warm and dry.     Capillary Refill: Capillary refill takes less than 2 seconds.     Findings: Rash (generalized vesicular rash with scattered bullae to bilateral lower extremities, worse on R foot. Similar rash to upper extremities) present. No bruising or lesion.     Comments: Several bullae weeping with honey colored crusts  Neurological:     General: No focal deficit present.     Mental Status: He is alert and oriented to person, place, and time.      UC Treatments / Results  Labs (all labs ordered are listed, but only abnormal results are displayed) Labs Reviewed - No data to display  EKG   Radiology No results found.  Procedures Procedures (including critical care time)  Medications Ordered in UC Medications  dexamethasone (DECADRON) injection 10 mg (10 mg Intramuscular Given 09/11/21 1910)     Initial Impression / Assessment and Plan / UC Course  I have reviewed the triage vital signs and the nursing notes.  Pertinent labs & imaging results that were available  during my care of the patient were reviewed by me and considered in my medical decision making (see chart for details).  Clinical Course as of 09/11/21 1912  Fri Sep 11, 2021  1852 Recheck BP [WC]  1853 Recheck BP 143/96 [WC]    Clinical Course User Index [WC] Guy Sandifer L, PA    Contact dermatitis due to poison ivy - dexamethasone injection given in office. Pt to take 10 day taper at home, PRN benadryl as needed. Side effect profile from medications discussed. Topical mupirocin over draining lesions to prevent impetigo Elevated BP - rechecked in office with significant improvement, however still elevated. Recommended pt establish care with a PCP and monitor BP at home with goal readings closer to 120/80.   Final Clinical Impressions(s) / UC Diagnoses   Final diagnoses:  Poison ivy dermatitis  Elevated blood pressure reading     Discharge Instructions      Your rash is due to poison ivy.  Please avoid itching your rash as this can cause secondary bacterial infections in severe cases. You can continue topical steroid cream, but for no longer than 14 days consecutively. Avoid use on the face. Start taking the prednisone taper as directed. Best to take in the mornings to prevent insomnia. You may also take 25-50mg  benadryl to help with the itching and to offset any sleep issues on the steroids. Apply topical mupirocin to the bullae that are crusting. Avoid alcohol wipes or topical sprays as this is drying and can make it more itchy. Follow up if rash worsens, or systemic symptoms develop including wheezing.   Monitor BP at home, goal reading 120/80. Establish care with PCP, scan QR code at checkout to obtain new provider.     ED Prescriptions     Medication Sig Dispense Auth. Provider   predniSONE  (STERAPRED UNI-PAK 21 TAB) 10 MG (21) TBPK tablet Take by mouth daily. Take 6 tabs by mouth daily  for 2 days, then 5 tabs for 2 days, then 4 tabs for 2 days, then 3 tabs for 2 days, 2 tabs for 2 days, then 1 tab by mouth daily for 2 days 42 tablet Marshella Tello L, PA   mupirocin ointment (BACTROBAN) 2 % Apply 1 Application topically 2 (two) times daily. 22 g Malyk Girouard L, [place]      PDMP not reviewed this encounter.   Maretta Bees, [place] 09/11/21 1925

## 2021-09-11 NOTE — ED Triage Notes (Signed)
Pt has poison ivy/oak all over except face x 1day. C/o pain and itchy with drainage noted.

## 2022-04-22 ENCOUNTER — Other Ambulatory Visit: Payer: Commercial Managed Care - HMO

## 2022-04-22 DIAGNOSIS — Z13220 Encounter for screening for lipoid disorders: Secondary | ICD-10-CM

## 2022-04-22 DIAGNOSIS — B182 Chronic viral hepatitis C: Secondary | ICD-10-CM

## 2022-04-22 DIAGNOSIS — F1111 Opioid abuse, in remission: Secondary | ICD-10-CM

## 2022-04-22 DIAGNOSIS — R1013 Epigastric pain: Secondary | ICD-10-CM

## 2022-04-26 ENCOUNTER — Other Ambulatory Visit: Payer: Self-pay | Admitting: Family Medicine

## 2022-04-26 DIAGNOSIS — R739 Hyperglycemia, unspecified: Secondary | ICD-10-CM

## 2022-04-27 LAB — COMPLETE METABOLIC PANEL WITH GFR
AG Ratio: 1.6 (calc) (ref 1.0–2.5)
ALT: 11 U/L (ref 9–46)
AST: 16 U/L (ref 10–40)
Albumin: 4.3 g/dL (ref 3.6–5.1)
Alkaline phosphatase (APISO): 44 U/L (ref 36–130)
BUN: 16 mg/dL (ref 7–25)
CO2: 29 mmol/L (ref 20–32)
Calcium: 9.7 mg/dL (ref 8.6–10.3)
Chloride: 104 mmol/L (ref 98–110)
Creat: 0.96 mg/dL (ref 0.60–1.26)
Globulin: 2.7 g/dL (calc) (ref 1.9–3.7)
Glucose, Bld: 122 mg/dL — ABNORMAL HIGH (ref 65–99)
Potassium: 4.6 mmol/L (ref 3.5–5.3)
Sodium: 140 mmol/L (ref 135–146)
Total Bilirubin: 0.8 mg/dL (ref 0.2–1.2)
Total Protein: 7 g/dL (ref 6.1–8.1)
eGFR: 103 mL/min/{1.73_m2} (ref >=60)

## 2022-04-27 LAB — CBC WITH DIFFERENTIAL/PLATELET
Absolute Monocytes: 473 cells/uL (ref 200–950)
Basophils Absolute: 30 cells/uL (ref 0–200)
Basophils Relative: 0.7 %
Eosinophils Absolute: 112 cells/uL (ref 15–500)
Eosinophils Relative: 2.6 %
HCT: 42.3 % (ref 38.5–50.0)
Hemoglobin: 14.2 g/dL (ref 13.2–17.1)
Lymphs Abs: 1587 cells/uL (ref 850–3900)
MCH: 32.3 pg (ref 27.0–33.0)
MCHC: 33.6 g/dL (ref 32.0–36.0)
MCV: 96.1 fL (ref 80.0–100.0)
MPV: 9.9 fL (ref 7.5–12.5)
Monocytes Relative: 11 %
Neutro Abs: 2098 cells/uL (ref 1500–7800)
Neutrophils Relative %: 48.8 %
Platelets: 275 10*3/uL (ref 140–400)
RBC: 4.4 10*6/uL (ref 4.20–5.80)
RDW: 11.3 % (ref 11.0–15.0)
Total Lymphocyte: 36.9 %
WBC: 4.3 10*3/uL (ref 3.8–10.8)

## 2022-04-27 LAB — LIPID PANEL
Cholesterol: 145 mg/dL (ref <200)
HDL: 50 mg/dL (ref >=40)
LDL Cholesterol (Calc): 73 mg/dL (calc)
Non-HDL Cholesterol (Calc): 95 mg/dL (calc) (ref <130)
Total CHOL/HDL Ratio: 2.9 (calc) (ref <5.0)
Triglycerides: 135 mg/dL (ref <150)

## 2022-04-27 LAB — TEST AUTHORIZATION

## 2022-04-27 LAB — HEMOGLOBIN A1C
Hgb A1c MFr Bld: 5.3 % of total Hgb (ref <5.7)
Mean Plasma Glucose: 105 mg/dL
eAG (mmol/L): 5.8 mmol/L

## 2022-04-28 ENCOUNTER — Ambulatory Visit (INDEPENDENT_AMBULATORY_CARE_PROVIDER_SITE_OTHER): Payer: 59 | Admitting: Family Medicine

## 2022-04-28 ENCOUNTER — Encounter: Payer: Self-pay | Admitting: Family Medicine

## 2022-04-28 VITALS — BP 138/98 | HR 74 | Temp 98.5°F | Ht 70.0 in | Wt 178.0 lb

## 2022-04-28 DIAGNOSIS — Z0001 Encounter for general adult medical examination with abnormal findings: Secondary | ICD-10-CM | POA: Diagnosis not present

## 2022-04-28 DIAGNOSIS — R030 Elevated blood-pressure reading, without diagnosis of hypertension: Secondary | ICD-10-CM | POA: Diagnosis not present

## 2022-04-28 DIAGNOSIS — Z0000 Encounter for general adult medical examination without abnormal findings: Secondary | ICD-10-CM

## 2022-04-28 DIAGNOSIS — F1111 Opioid abuse, in remission: Secondary | ICD-10-CM | POA: Diagnosis not present

## 2022-04-28 DIAGNOSIS — B182 Chronic viral hepatitis C: Secondary | ICD-10-CM

## 2022-04-28 NOTE — Assessment & Plan Note (Addendum)
BP elevated in office today. 138/98.  He denies past history of elevated readings. He does report consuming a healthy diet and staying very active. Denies chest pain, palpitations, shortness of breath, vision changes, or recurrent headaches. Encouraged to check BP at home or drug store and report reading via MyChart, will follow up in office in 2 weeks. Counseled on heart healthy diet and importance of 150 minutes moderate intensity exercise weekly and tobacco cessation.

## 2022-04-28 NOTE — Assessment & Plan Note (Addendum)
Completed antiviral therapy. Did not follow up for labs.

## 2022-04-28 NOTE — Assessment & Plan Note (Signed)
Remains drug free. Is weaning off Klonopin currently and is taking Bup-Naloxone 6mg 

## 2022-04-28 NOTE — Progress Notes (Signed)
New Patient Office Visit  Subjective    Patient ID: John Lozano, male    DOB: 08-05-1980  Age: 42 y.o. MRN: [id number]  CC:  Chief Complaint  Patient presents with   Establish Care    HPI John Lozano presents to establish care. Oriented to practice routines and expectations. PMH includes allergies, opoid use, asthma, and history of erectile dysfunction. Current concerns include reducing Klonopin use and Buprenorphine use. He is currently self-weaning Klonopin by 0.25mg  every 2 weeks or 1.5 months and is experiencing headaches and feeling crummy. Will wean Buprenorphine after.  Tobacco: vapes Vaccines: declines   Outpatient Encounter Medications as of 04/28/2022  Medication Sig   Buprenorphine HCl-Naloxone HCl 8-2 MG FILM Place 6 mg of opioid under the tongue daily.   cetirizine (ZYRTEC) 10 MG tablet Take 10 mg by mouth daily.   clonazePAM (KLONOPIN) 1 MG tablet Take 1.75 mg by mouth daily.   [DISCONTINUED] mupirocin ointment (BACTROBAN) 2 % Apply 1 Application topically 2 (two) times daily. (Patient not taking: Reported on 04/28/2022)   [DISCONTINUED] predniSONE (STERAPRED UNI-PAK 21 TAB) 10 MG (21) TBPK tablet Take by mouth daily. Take 6 tabs by mouth daily  for 2 days, then 5 tabs for 2 days, then 4 tabs for 2 days, then 3 tabs for 2 days, 2 tabs for 2 days, then 1 tab by mouth daily for 2 days (Patient not taking: Reported on 04/28/2022)   [DISCONTINUED] Sofosbuvir-Velpatasvir (EPCLUSA) 400-100 MG TABS Take 1 tablet by mouth daily. (Patient not taking: Reported on 04/28/2022)   No facility-administered encounter medications on file as of 04/28/2022.    Past Medical History:  Diagnosis Date   Asthma    GERD (gastroesophageal reflux disease)    Kidney stones    Open ankle fracture 11/13/2014    Past Surgical History:  Procedure Laterality Date   I & D EXTREMITY Right 11/13/2014   Procedure:  IRRIGATION AND DEBRIDEMENT RIGHT ANKLE, complex wound closure, placement  of Vac, and splinting of right ankle ;  Surgeon: Melina Schools, MD;  Location: [facility];  Service: Orthopedics;  Laterality: Right;   ORIF ANKLE FRACTURE Right 11/23/2014   Procedure: OPEN REDUCTION INTERNAL FIXATION (ORIF) RIGHT  BIMALLEOLAR ANKLE FRACTURE;  Surgeon: Wylene Simmer, MD;  Location: [facility];  Service: Orthopedics;  Laterality: Right;   WISDOM TOOTH EXTRACTION      Family History  Problem Relation Age of Onset   Healthy Mother    Healthy Father     Social History   Socioeconomic History   Marital status: Single    Spouse name: Not on file   Number of children: Not on file   Years of education: Not on file   Highest education level: Not on file  Occupational History   Not on file  Tobacco Use   Smoking status: Every Day    Types: E-cigarettes   Smokeless tobacco: Never  Substance and Sexual Activity   Alcohol use: Not Currently    Alcohol/week: 4.0 standard drinks of alcohol    Types: 4 Cans of beer per week    Comment: occasional drink   Drug use: Not Currently    Types: Benzodiazepines    Comment: clonazepam   Sexual activity: Yes    Partners: Female    Birth control/protection: Condom    Comment: 1 monogamous  Other Topics Concern   Not on file  Social History Narrative   Not on file   Social Determinants of Health  Financial Resource Strain: Not on file  Food Insecurity: Not on file  Transportation Needs: Not on file  Physical Activity: Not on file  Stress: Not on file  Social Connections: Not on file  Intimate Partner Violence: Not on file    Review of Systems  Constitutional: Negative.   HENT: Negative.    Eyes: Negative.   Respiratory: Negative.    Cardiovascular: Negative.   Gastrointestinal: Negative.   Genitourinary: Negative.   Musculoskeletal: Negative.   Skin: Negative.   Neurological:  Positive for headaches.  Endo/Heme/Allergies: Negative.   Psychiatric/Behavioral: Negative.    All other systems reviewed and are negative.        Objective    BP (!) 138/98   Pulse 74   Temp 98.5 F (36.9 C) (Oral)   Ht 5\' 10"  (1.778 m)   Wt 178 lb (80.7 kg)   SpO2 97%   BMI 25.54 kg/m    Physical Exam Vitals and nursing note reviewed.  Constitutional:      Appearance: Normal appearance. He is normal weight.  HENT:     Head: Normocephalic and atraumatic.     Right Ear: Tympanic membrane, ear canal and external ear normal.     Left Ear: Tympanic membrane, ear canal and external ear normal.     Nose: Nose normal.     Mouth/Throat:     Mouth: Mucous membranes are moist.     Pharynx: Oropharynx is clear.  Eyes:     Extraocular Movements: Extraocular movements intact.     Right eye: Normal extraocular motion and no nystagmus.     Left eye: Normal extraocular motion and no nystagmus.     Conjunctiva/sclera: Conjunctivae normal.     Pupils: Pupils are equal, round, and reactive to light.  Cardiovascular:     Rate and Rhythm: Normal rate and regular rhythm.     Pulses: Normal pulses.     Heart sounds: Normal heart sounds.  Pulmonary:     Effort: Pulmonary effort is normal.     Breath sounds: Normal breath sounds.  Abdominal:     General: Bowel sounds are normal.     Palpations: Abdomen is soft.  Genitourinary:    Comments: Deferred using shared decision making Musculoskeletal:        General: Normal range of motion.     Cervical back: Normal range of motion and neck supple.  Skin:    General: Skin is warm and dry.     Capillary Refill: Capillary refill takes less than 2 seconds.  Neurological:     General: No focal deficit present.     Mental Status: He is alert. Mental status is at baseline.  Psychiatric:        Mood and Affect: Mood normal.        Speech: Speech normal.        Behavior: Behavior normal.        Thought Content: Thought content normal.        Cognition and Memory: Cognition and memory normal.        Judgment: Judgment normal.         Assessment & Plan:   Problem List Items  Addressed This Visit       Digestive   Chronic hepatitis C without hepatic coma ([place])    Completed antiviral therapy. Did not follow up for labs.        Other   History of opioid abuse ([place])    Remains drug free. Is weaning  off Klonopin currently and is taking Bup-Naloxone 6mg       Elevated blood-pressure reading without diagnosis of hypertension    BP elevated in office today. 138/98.  He denies past history of elevated readings. He does report consuming a healthy diet and staying very active. Denies chest pain, palpitations, shortness of breath, vision changes, or recurrent headaches. Encouraged to check BP at home or drug store and report reading via MyChart, will follow up in office in 2 weeks. Counseled on heart healthy diet and importance of 150 minutes moderate intensity exercise weekly and tobacco cessation.      Physical exam, annual - Primary    Today your medical history was reviewed and routine physical exam with labs was performed. Recommend 150 minutes of moderate intensity exercise weekly and consuming a well-balanced diet. Advised to stop smoking if a smoker, avoid smoking if a non-smoker, limit alcohol consumption to 1 drink per day for women and 2 drinks per day for men, and avoid illicit drug use. Counseled on safe sex practices and offered STI testing today. Counseled on the importance of sunscreen use. Counseled in mental health awareness and when to seek medical care. Vaccine maintenance discussed. Appropriate health maintenance items reviewed. Return to office in 1 year for annual physical exam.        Return in about 2 weeks (around 05/12/2022) for bp follow-up.   Rubie Maid, FNP

## 2022-04-28 NOTE — Patient Instructions (Signed)
It was great to meet you today and I'm excited to have you join the [facility] practice. I hope you had a positive experience today! If you feel so inclined, please feel free to recommend our practice to friends and family. Tania Steinhauser, FNP-C  

## 2022-04-28 NOTE — Assessment & Plan Note (Signed)

## 2022-05-17 ENCOUNTER — Encounter: Payer: Self-pay | Admitting: Family Medicine

## 2022-05-17 ENCOUNTER — Ambulatory Visit (INDEPENDENT_AMBULATORY_CARE_PROVIDER_SITE_OTHER): Payer: 59 | Admitting: Family Medicine

## 2022-05-17 VITALS — BP 124/92 | HR 71 | Temp 97.8°F | Ht 70.0 in | Wt 178.0 lb

## 2022-05-17 DIAGNOSIS — R030 Elevated blood-pressure reading, without diagnosis of hypertension: Secondary | ICD-10-CM | POA: Diagnosis not present

## 2022-05-17 NOTE — Progress Notes (Signed)
Acute Office Visit  Subjective:     Patient ID: John Lozano, male    DOB: 28-Feb-1980, 42 y.o.   MRN: [id number]  Chief Complaint  Patient presents with   Follow-up    f/u on BP/dis bp meds     HPI Patient is in today for blood pressure follow up. He did obtain a blood pressure cuff for home and his reading was 120/85. BP is improved today 124/92. He is continuing to try to wean his Klonopin. He is continuing to work on lifestyle modifications including exercising 30 minutes daily and eating better. He denies chest pain, palpitations, shortness of breath, swelling of extremities, recurrent headaches, or vision changes.  Review of Systems  Respiratory:  Positive for shortness of breath.   Cardiovascular: Negative.   All other systems reviewed and are negative.   Past Medical History:  Diagnosis Date   Asthma    GERD (gastroesophageal reflux disease)    Kidney stones    Open ankle fracture 11/13/2014   Past Surgical History:  Procedure Laterality Date   I & D EXTREMITY Right 11/13/2014   Procedure:  IRRIGATION AND DEBRIDEMENT RIGHT ANKLE, complex wound closure, placement of Vac, and splinting of right ankle ;  Surgeon: Venita Lick, MD;  Location: [facility];  Service: Orthopedics;  Laterality: Right;   ORIF ANKLE FRACTURE Right 11/23/2014   Procedure: OPEN REDUCTION INTERNAL FIXATION (ORIF) RIGHT  BIMALLEOLAR ANKLE FRACTURE;  Surgeon: Toni Arthurs, MD;  Location: [facility];  Service: Orthopedics;  Laterality: Right;   WISDOM TOOTH EXTRACTION     Current Outpatient Medications on File Prior to Visit  Medication Sig Dispense Refill   Buprenorphine HCl-Naloxone HCl 8-2 MG FILM Place 6 mg of opioid under the tongue daily.     cetirizine (ZYRTEC) 10 MG tablet Take 10 mg by mouth daily.     clonazePAM (KLONOPIN) 1 MG tablet Take 1.75 mg by mouth daily.     No current facility-administered medications on file prior to visit.   Allergies  Allergen Reactions   Shellfish Allergy  Swelling        Objective:    BP (!) 124/92   Pulse 71   Temp 97.8 F (36.6 C) (Oral)   Ht 5\' 10"  (1.778 m)   Wt 178 lb (80.7 kg)   SpO2 97%   BMI 25.54 kg/m  BP Readings from Last 3 Encounters:  05/17/22 (!) 124/92  04/28/22 (!) 138/98  09/11/21 (!) 177/93      Physical Exam Vitals and nursing note reviewed.  Constitutional:      Appearance: Normal appearance. He is normal weight.  HENT:     Head: Normocephalic and atraumatic.  Cardiovascular:     Rate and Rhythm: Normal rate and regular rhythm.     Pulses: Normal pulses.     Heart sounds: Normal heart sounds.  Pulmonary:     Effort: Pulmonary effort is normal.     Breath sounds: Normal breath sounds.  Skin:    General: Skin is warm and dry.     Capillary Refill: Capillary refill takes less than 2 seconds.  Neurological:     General: No focal deficit present.     Mental Status: He is alert and oriented to person, place, and time. Mental status is at baseline.  Psychiatric:        Mood and Affect: Mood normal.        Behavior: Behavior normal.        Thought Content: Thought  content normal.        Judgment: Judgment normal.     No results found for any visits on 05/17/22.      Assessment & Plan:   Problem List Items Addressed This Visit       Other   Elevated blood-pressure reading without diagnosis of hypertension - Primary    BP improved in office today and remains <140/90 at home. He does report consuming a healthy diet and staying very active. Denies chest pain, palpitations, shortness of breath, vision changes, or recurrent headaches. Encouraged to continue monitoring at home and report to office if values sustain >140/90. Reinforced importance of heart healthy diet and importance of 150 minutes moderate intensity exercise weekly and tobacco cessation. Follow up in 3 months.       No orders of the defined types were placed in this encounter.   Return in about 3 months (around 08/16/2022) for BP  recheck.  [name], FNP

## 2022-05-17 NOTE — Assessment & Plan Note (Addendum)
BP improved in office today and remains <140/90 at home. He does report consuming a healthy diet and staying very active. Denies chest pain, palpitations, shortness of breath, vision changes, or recurrent headaches. Encouraged to continue monitoring at home and report to office if values sustain >140/90. Reinforced importance of heart healthy diet and importance of 150 minutes moderate intensity exercise weekly and tobacco cessation. Follow up in 3 months.

## 2022-08-09 ENCOUNTER — Ambulatory Visit: Payer: 59 | Admitting: Family Medicine

## 2022-08-16 ENCOUNTER — Ambulatory Visit: Payer: 59 | Admitting: Family Medicine

## 2022-08-19 ENCOUNTER — Encounter: Payer: Self-pay | Admitting: Family Medicine

## 2022-08-19 ENCOUNTER — Ambulatory Visit (INDEPENDENT_AMBULATORY_CARE_PROVIDER_SITE_OTHER): Payer: 59 | Admitting: Family Medicine

## 2022-08-19 VITALS — BP 115/82 | HR 69 | Temp 97.9°F | Ht 70.0 in | Wt 170.0 lb

## 2022-08-19 DIAGNOSIS — R1032 Left lower quadrant pain: Secondary | ICD-10-CM | POA: Insufficient documentation

## 2022-08-19 DIAGNOSIS — R197 Diarrhea, unspecified: Secondary | ICD-10-CM | POA: Diagnosis not present

## 2022-08-19 NOTE — Assessment & Plan Note (Signed)
No red flags on exam, will obtain stool studies, CBC, and CMP. Diarrhea is resolved but abdominal pain persists. I encouraged him to push electrolyte fluids. Will consider abdominal CT if pain persists, he declines this time. Instructed to seek immediate medical care for acutely worsening abdominal pain, blood in stool, fever, or worsening or persistent symptoms.

## 2022-08-19 NOTE — Progress Notes (Signed)
Subjective:  HPI: John Lozano is a 42 y.o. male presenting on 08/19/2022 for Follow-up (stomach problems/)   HPI Patient is in today for 2 weeks of diarrhea that has been resolved for 1 week. He also reports he has an increased appetite and is losing weight and has abdominal cramping as well as left sided sharp pains. He denies fever, nausea, vomiting, blood in stool. He is a Administrator, [employer] and is around animals, had a recent cat bite, and also reports many tick bites lately. He cannot identify any triggers.   Review of Systems  All other systems reviewed and are negative.   Relevant past medical history reviewed and updated as indicated.   Past Medical History:  Diagnosis Date   Asthma    GERD (gastroesophageal reflux disease)    Kidney stones    Open ankle fracture 11/13/2014     Past Surgical History:  Procedure Laterality Date   I & D EXTREMITY Right 11/13/2014   Procedure:  IRRIGATION AND DEBRIDEMENT RIGHT ANKLE, complex wound closure, placement of Vac, and splinting of right ankle ;  Surgeon: Venita Lick, MD;  Location: [facility];  Service: Orthopedics;  Laterality: Right;   ORIF ANKLE FRACTURE Right 11/23/2014   Procedure: OPEN REDUCTION INTERNAL FIXATION (ORIF) RIGHT  BIMALLEOLAR ANKLE FRACTURE;  Surgeon: Toni Arthurs, MD;  Location: [facility];  Service: Orthopedics;  Laterality: Right;   WISDOM TOOTH EXTRACTION      Allergies and medications reviewed and updated.   Current Outpatient Medications:    Buprenorphine HCl-Naloxone HCl 8-2 MG FILM, Place 6 mg of opioid under the tongue daily., Disp: , Rfl:    cetirizine (ZYRTEC) 10 MG tablet, Take 10 mg by mouth daily., Disp: , Rfl:    clonazePAM (KLONOPIN) 1 MG tablet, Take 1.75 mg by mouth daily., Disp: , Rfl:   Allergies  Allergen Reactions   Shellfish Allergy Swelling    Objective:   BP 115/82   Pulse 69   Temp 97.9 F (36.6 C) (Oral)   Ht 5\' 10"  (1.778 m)   Wt 170 lb (77.1 kg)   SpO2 97%   BMI 24.39 kg/m       08/19/2022    2:53 PM 05/17/2022    2:49 PM 05/17/2022    2:44 PM  Vitals with BMI  Height 5\' 10"   5\' 10"   Weight 170 lbs  178 lbs  BMI 24.39  25.54  Systolic 115 124 409  Diastolic 82 92 90  Pulse 69  71     Physical Exam Vitals and nursing note reviewed.  Constitutional:      Appearance: Normal appearance. He is normal weight.  HENT:     Head: Normocephalic and atraumatic.  Cardiovascular:     Rate and Rhythm: Normal rate and regular rhythm.     Pulses: Normal pulses.     Heart sounds: Normal heart sounds.  Pulmonary:     Effort: Pulmonary effort is normal.     Breath sounds: Normal breath sounds.  Abdominal:     Tenderness: There is abdominal tenderness in the left upper quadrant and left lower quadrant.  Skin:    General: Skin is warm and dry.     Capillary Refill: Capillary refill takes less than 2 seconds.  Neurological:     General: No focal deficit present.     Mental Status: He is alert and oriented to person, place, and time. Mental status is at baseline.  Psychiatric:  Mood and Affect: Mood normal.        Behavior: Behavior normal.        Thought Content: Thought content normal.        Judgment: Judgment normal.     Assessment & Plan:  Left lower quadrant abdominal pain Assessment & Plan: No red flags on exam, will obtain stool studies, CBC, and CMP. Diarrhea is resolved but abdominal pain persists. I encouraged him to push electrolyte fluids. Will consider abdominal CT if pain persists, he declines this time. Instructed to seek immediate medical care for acutely worsening abdominal pain, blood in stool, fever, or worsening or persistent symptoms.  Orders: -     Urinalysis, Routine w reflex microscopic  Diarrhea, unspecified type -     Ova and parasite examination -     Salmonella/Shigella Cult, Campy EIA and Shiga Toxin reflex -     Clostridium difficile culture-fecal -     CBC with Differential/Platelet -     COMPLETE METABOLIC PANEL WITH GFR      Follow up plan: Return if symptoms worsen or fail to improve.  [name], FNP

## 2022-08-20 ENCOUNTER — Other Ambulatory Visit: Payer: 59

## 2022-08-20 LAB — COMPLETE METABOLIC PANEL WITH GFR
AG Ratio: 1.9 (calc) (ref 1.0–2.5)
ALT: 12 U/L (ref 9–46)
AST: 21 U/L (ref 10–40)
Albumin: 4.5 g/dL (ref 3.6–5.1)
Alkaline phosphatase (APISO): 41 U/L (ref 36–130)
BUN: 16 mg/dL (ref 7–25)
CO2: 30 mmol/L (ref 20–32)
Calcium: 9.6 mg/dL (ref 8.6–10.3)
Chloride: 99 mmol/L (ref 98–110)
Creat: 0.93 mg/dL (ref 0.60–1.26)
Globulin: 2.4 g/dL (calc) (ref 1.9–3.7)
Glucose, Bld: 86 mg/dL (ref 65–99)
Potassium: 4.2 mmol/L (ref 3.5–5.3)
Sodium: 137 mmol/L (ref 135–146)
Total Bilirubin: 1.2 mg/dL (ref 0.2–1.2)
Total Protein: 6.9 g/dL (ref 6.1–8.1)
eGFR: 107 mL/min/{1.73_m2} (ref >=60)

## 2022-08-20 LAB — CBC WITH DIFFERENTIAL/PLATELET
Absolute Monocytes: 633 cells/uL (ref 200–950)
Basophils Absolute: 39 cells/uL (ref 0–200)
Basophils Relative: 0.7 %
Eosinophils Absolute: 83 cells/uL (ref 15–500)
Eosinophils Relative: 1.5 %
HCT: 38.4 % — ABNORMAL LOW (ref 38.5–50.0)
Hemoglobin: 13 g/dL — ABNORMAL LOW (ref 13.2–17.1)
Lymphs Abs: 1986 cells/uL (ref 850–3900)
MCH: 32.3 pg (ref 27.0–33.0)
MCHC: 33.9 g/dL (ref 32.0–36.0)
MCV: 95.3 fL (ref 80.0–100.0)
MPV: 10 fL (ref 7.5–12.5)
Monocytes Relative: 11.5 %
Neutro Abs: 2761 cells/uL (ref 1500–7800)
Neutrophils Relative %: 50.2 %
Platelets: 258 10*3/uL (ref 140–400)
RBC: 4.03 10*6/uL — ABNORMAL LOW (ref 4.20–5.80)
RDW: 11.6 % (ref 11.0–15.0)
Total Lymphocyte: 36.1 %
WBC: 5.5 10*3/uL (ref 3.8–10.8)

## 2022-08-24 LAB — SALMONELLA/SHIGELLA CULT, CAMPY EIA AND SHIGA TOXIN RFL ECOLI
MICRO NUMBER: 15193651
MICRO NUMBER:: 15193652
MICRO NUMBER:: 15193654
Result:: NOT DETECTED
SHIGA RESULT:: NOT DETECTED
SPECIMEN QUALITY: ADEQUATE
SPECIMEN QUALITY:: ADEQUATE
SPECIMEN QUALITY:: ADEQUATE

## 2022-08-24 LAB — OVA AND PARASITE EXAMINATION
CONCENTRATE RESULT:: NONE SEEN
MICRO NUMBER:: 15193653
SPECIMEN QUALITY:: ADEQUATE
TRICHROME RESULT:: NONE SEEN

## 2022-08-26 LAB — CLOSTRIDIUM DIFFICILE CULTURE-FECAL

## 2022-10-23 IMAGING — US US ABDOMEN COMPLETE
1 series · 13 of 25 positions shown · non-contrast
Comparison: None.

CLINICAL DATA: Hepatitis C

EXAM:
ABDOMEN ULTRASOUND COMPLETE

[Series 1: us abdomen complete · 0.26mm/px · 13 of 97 slices shown]
[im 1/97]
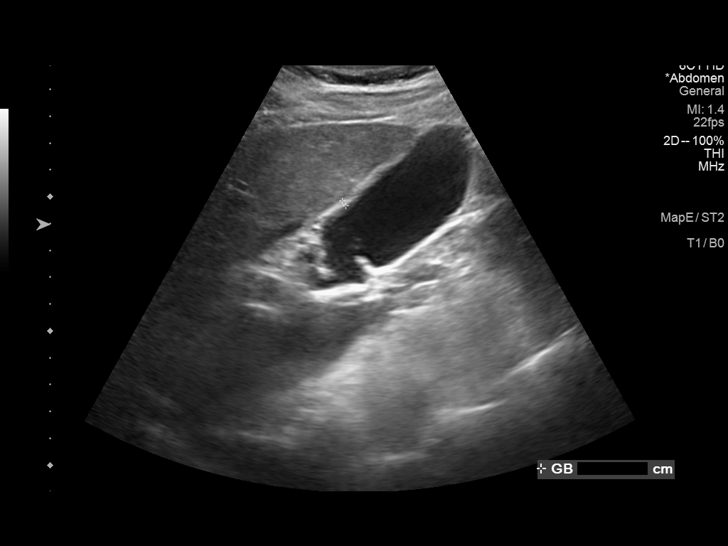
[im 9/97]
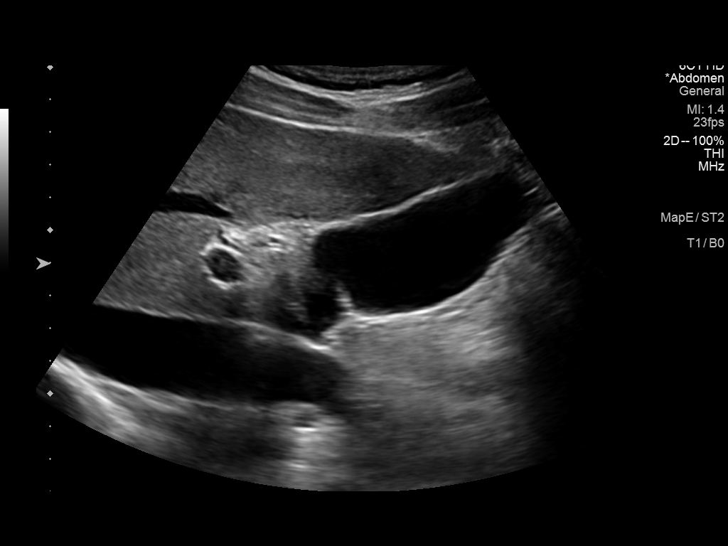
[im 17/97]
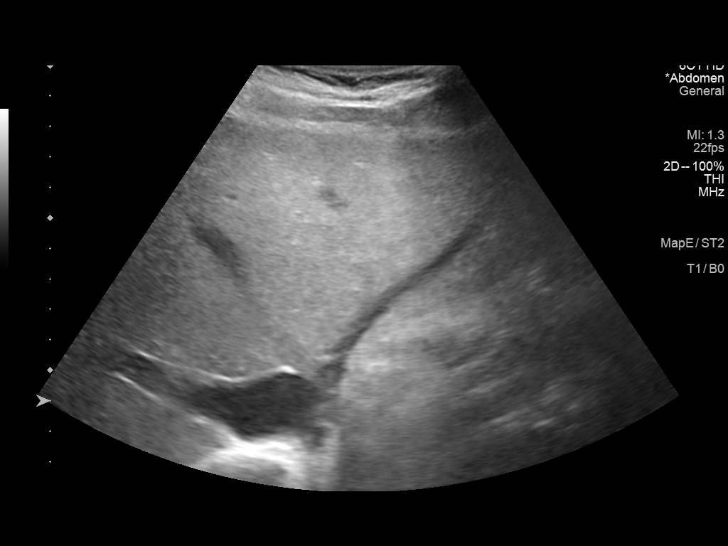
[im 25/97]
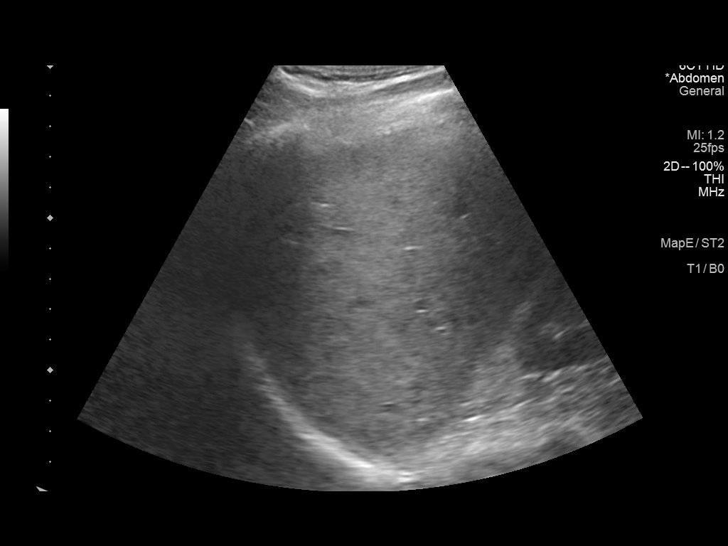
[im 33/97]
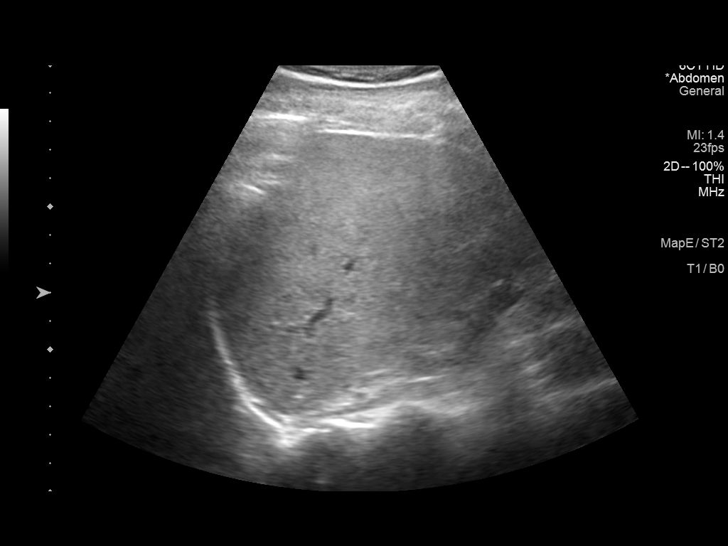
[im 41/97]
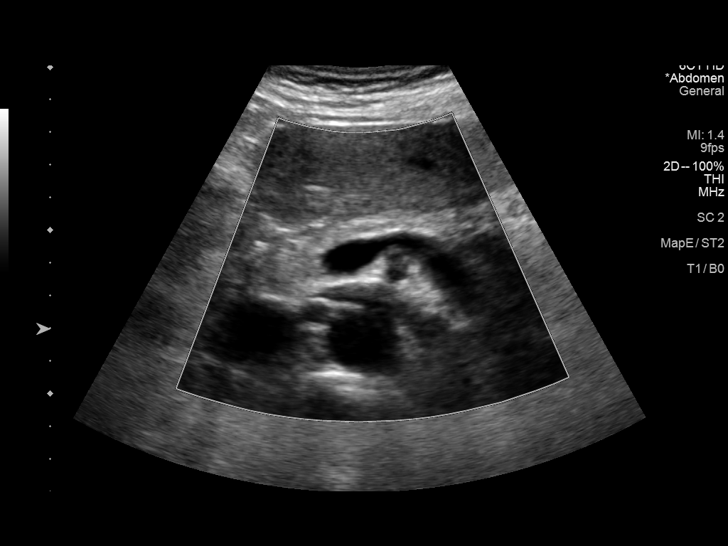
[im 49/97]
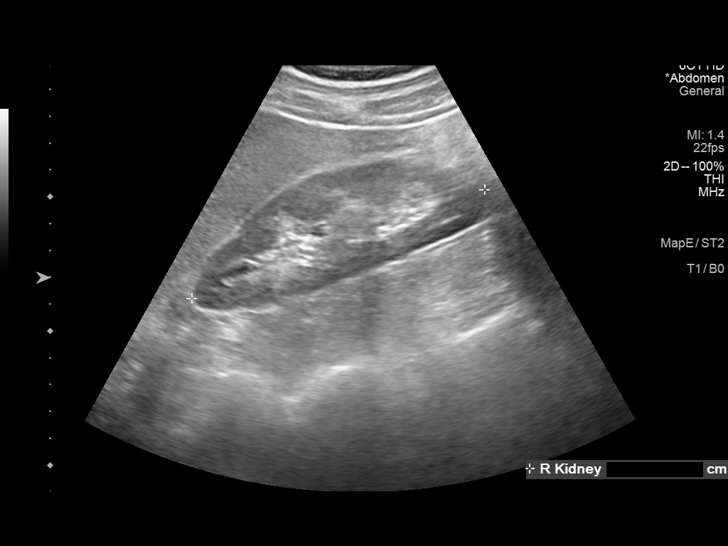
[im 57/97]
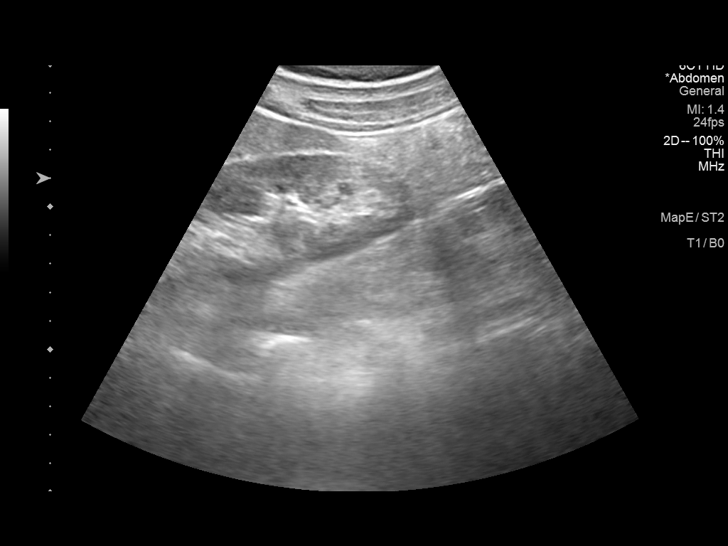
[im 65/97]
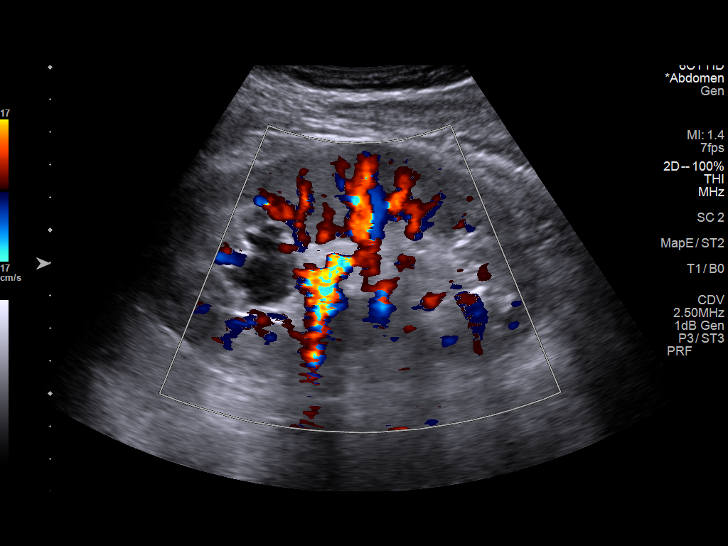
[im 73/97]
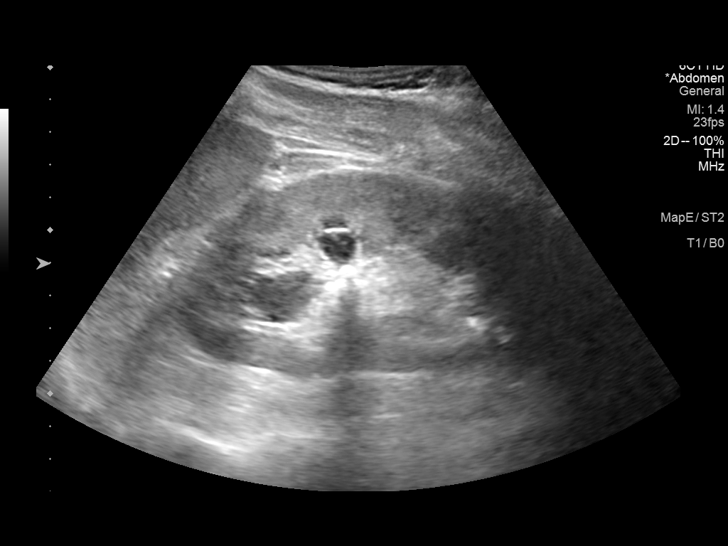
[im 81/97]
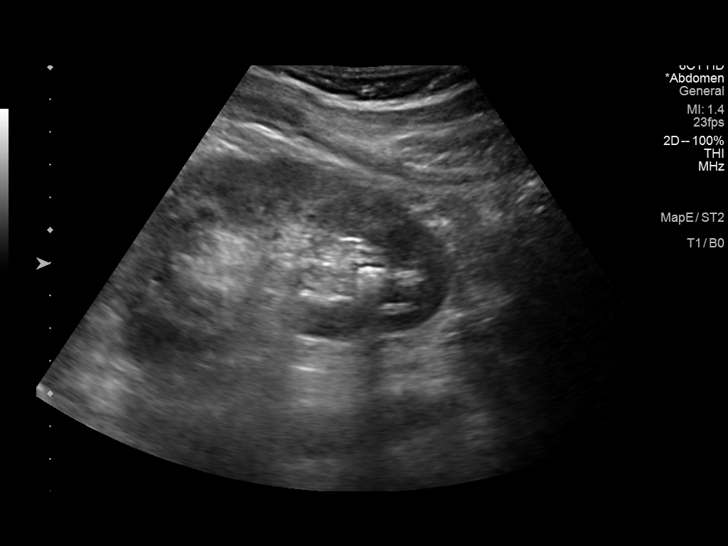
[im 89/97]
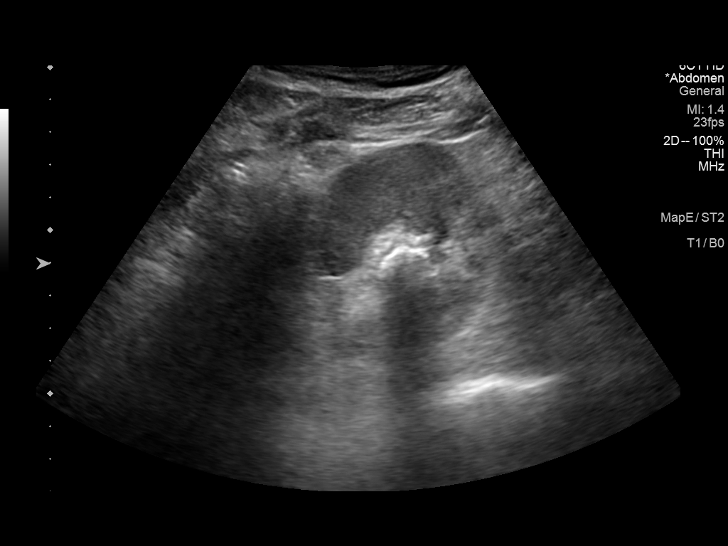
[im 97/97]
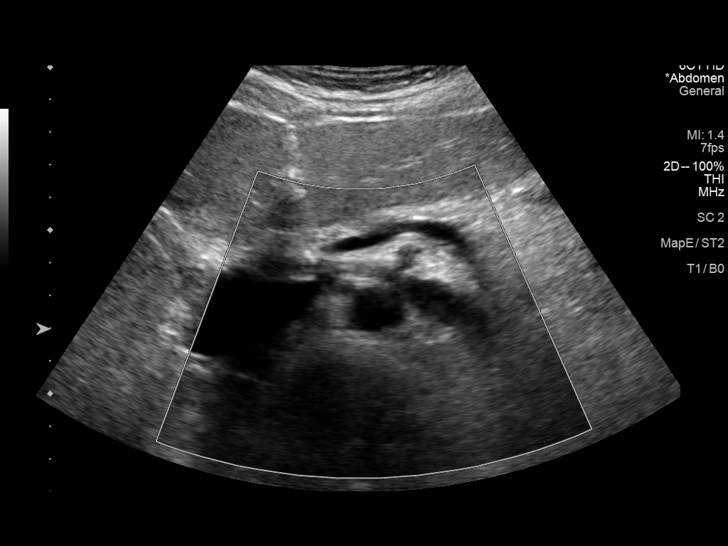

[13 of 25 positions shown; findings below may reference images not displayed]

FINDINGS: Gallbladder: No gallstones or wall thickening visualized. No
sonographic Murphy sign noted by sonographer.

Common bile duct: Diameter: 2 mm

Liver: No focal lesion identified. Within normal limits in
parenchymal echogenicity. Portal vein is patent on color Doppler
imaging with normal direction of blood flow towards the liver.

IVC: No abnormality visualized.

Pancreas: Visualized portion unremarkable.

Spleen: Size and appearance within normal limits.

Right Kidney: Length: 11.7 cm. Echogenicity within normal limits. No
mass or hydronephrosis visualized.

Left Kidney: Length: 12.3 cm. The upper pole calices are dilated,
with a shadowing 7 mm calculus identified within the upper pole of
the left renal pelvis. Renal echotexture is normal.

Abdominal aorta: No aneurysm visualized.

Other findings: None.
IMPRESSION: 1. Dilated upper pole left renal calices, with 7 mm obstructing
calculus within the left renal pelvis.
2. Otherwise unremarkable abdominal ultrasound.

These results will be called to the ordering clinician or
representative by the Radiologist Assistant, and communication
documented in the PACS or [REDACTED].

## 2022-12-16 ENCOUNTER — Encounter: Payer: Self-pay | Admitting: Family Medicine

## 2022-12-16 ENCOUNTER — Ambulatory Visit: Payer: 59 | Admitting: Family Medicine

## 2022-12-16 VITALS — BP 120/86 | HR 77 | Temp 98.8°F | Ht 70.0 in | Wt 173.2 lb

## 2022-12-16 DIAGNOSIS — F1320 Sedative, hypnotic or anxiolytic dependence, uncomplicated: Secondary | ICD-10-CM | POA: Diagnosis not present

## 2022-12-16 DIAGNOSIS — F1120 Opioid dependence, uncomplicated: Secondary | ICD-10-CM | POA: Diagnosis not present

## 2022-12-16 DIAGNOSIS — F419 Anxiety disorder, unspecified: Secondary | ICD-10-CM

## 2022-12-16 DIAGNOSIS — Z79899 Other long term (current) drug therapy: Secondary | ICD-10-CM

## 2022-12-16 NOTE — Assessment & Plan Note (Addendum)
UDS with med match performed today. He endorses anxiety and stress at home, would not like to try an alternative medication. Would like to continue trying to wean his Klonopin with my assistance. I have agreed to prescribe this for him short term with plans of safely weaning. Will also refer to psychiatry.

## 2022-12-16 NOTE — Assessment & Plan Note (Signed)
UDS completed today with med match. Will refer to pain clinic for assistance with this.

## 2022-12-16 NOTE — Progress Notes (Addendum)
Subjective:  HPI: John Lozano is a 42 y.o. male presenting on 12/16/2022 for Anxiety (Pt states he quit drinking about a week ago. Pt has been having some stressors at work. He has been very anxious. )   HPI Patient is in today for anxiety. He recently found out his girlfriend is pregnant, he has stopped drinking alcohol, was formerly drinking 3 beers daily and has not had a drink in 1 week. He has been taking 1mg  Clonazepam from a friend and recently had an end to his supply, he has been working on weaning off of this since at least March. He is also taking Buprenorphine 6mg  - Naloxone daily. He would like help obtaining these medication and weaning. He also endorses high stress and feelings of anxiousness.     12/16/2022    3:03 PM 08/19/2022    3:00 PM 05/17/2022    3:41 PM  GAD 7 : Generalized Anxiety Score  Nervous, Anxious, on Edge 3 0 2  Control/stop worrying 1 0 0  Worry too much - different things 1 0 0  Trouble relaxing 1 0 1  Restless 0 0 0  Easily annoyed or irritable 0 0 0  Afraid - awful might happen 0 0 0  Total GAD 7 Score 6 0 3  Anxiety Difficulty   Not difficult at all       12/16/2022    3:02 PM 08/19/2022    2:59 PM 05/17/2022    3:40 PM 10/30/2020    1:46 PM 09/10/2020    9:59 AM  Depression screen PHQ 2/9  Decreased Interest 1 0 0 0 0  Down, Depressed, Hopeless 1 0 0 0 0  PHQ - 2 Score 2 0 0 0 0  Altered sleeping 1 2 2     Tired, decreased energy 1 1 0    Change in appetite 1 2 2     Feeling bad or failure about yourself  0 0 0    Trouble concentrating 0 0 1    Moving slowly or fidgety/restless 0 0 0    Suicidal thoughts 0 0 0    PHQ-9 Score 5 5 5     Difficult doing work/chores Somewhat difficult  Not difficult at all       Review of Systems  All other systems reviewed and are negative.   Relevant past medical history reviewed and updated as indicated.   Past Medical History:  Diagnosis Date   Asthma    GERD (gastroesophageal reflux  disease)    Kidney stones    Open ankle fracture 11/13/2014     Past Surgical History:  Procedure Laterality Date   I & D EXTREMITY Right 11/13/2014   Procedure:  IRRIGATION AND DEBRIDEMENT RIGHT ANKLE, complex wound closure, placement of Vac, and splinting of right ankle ;  Surgeon: Venita Lick, MD;  [facility];  Service: Orthopedics;  Laterality: Right;   ORIF ANKLE FRACTURE Right 11/23/2014   Procedure: OPEN REDUCTION INTERNAL FIXATION (ORIF) RIGHT  BIMALLEOLAR ANKLE FRACTURE;  Surgeon: Toni Arthurs, MD;  [facility];  Service: Orthopedics;  Laterality: Right;   WISDOM TOOTH EXTRACTION      Allergies and medications reviewed and updated.   Current Outpatient Medications:    Buprenorphine HCl-Naloxone HCl 8-2 MG FILM, Place 6 mg of opioid under the tongue daily., Disp: , Rfl:    cetirizine (ZYRTEC) 10 MG tablet, Take 10 mg by mouth daily., Disp: , Rfl:    clonazePAM (KLONOPIN) 1 MG tablet,  Take 1 mg by mouth daily., Disp: , Rfl:   Allergies  Allergen Reactions   Shellfish Allergy Swelling    Objective:   BP 120/86 (BP Location: Left Arm)   Pulse 77   Temp 98.8 F (37.1 C)   Ht 5\' 10"  (1.778 m)   Wt 173 lb 4 oz (78.6 kg)   SpO2 99%   BMI 24.86 kg/m      12/16/2022    3:01 PM 08/19/2022    2:53 PM 05/17/2022    2:49 PM  Vitals with BMI  Height 5\' 10"  5\' 10"    Weight 173 lbs 4 oz 170 lbs   BMI 24.86 24.39   Systolic 120 115 528  Diastolic 86 82 92  Pulse 77 69      Physical Exam Vitals and nursing note reviewed.  Constitutional:      Appearance: Normal appearance. He is normal weight.  HENT:     Head: Normocephalic and atraumatic.  Skin:    General: Skin is warm and dry.     Capillary Refill: Capillary refill takes less than 2 seconds.  Neurological:     General: No focal deficit present.     Mental Status: He is alert and oriented to person, place, and time. Mental status is at baseline.  Psychiatric:        Mood and Affect: Mood is anxious.         Behavior: Behavior normal.        Thought Content: Thought content normal.        Judgment: Judgment normal.     Assessment & Plan:  Klonopin use disorder, moderate, dependence (HCC) Assessment & Plan: UDS with med match performed today. He endorses anxiety and stress at home, would not like to try an alternative medication. Would like to continue trying to wean his Klonopin with my assistance. I have agreed to prescribe this for him short term with plans of safely weaning. Will also refer to psychiatry.   Orders: -     Ambulatory referral to Psychiatry -     Ambulatory referral to Pain Clinic -     PRESCRIBED DRUGS,MEDMATCH(R) -     DRUG MONITOR, PANEL 4, W/CONF, URINE  Anxiety -     Ambulatory referral to Psychiatry -     Ambulatory referral to Pain Clinic -     PRESCRIBED DRUGS,MEDMATCH(R) -     DRUG MONITOR, PANEL 4, W/CONF, URINE  Medication management -     Ambulatory referral to Psychiatry -     Ambulatory referral to Pain Clinic -     PRESCRIBED DRUGS,MEDMATCH(R) -     DRUG MONITOR, PANEL 4, W/CONF, URINE  Controlled substance agreement signed -     Ambulatory referral to Psychiatry -     Ambulatory referral to Pain Clinic -     PRESCRIBED DRUGS,MEDMATCH(R) -     DRUG MONITOR, PANEL 4, W/CONF, URINE  Buprenorphine dependence (HCC) Assessment & Plan: UDS completed today with med match. Will refer to pain clinic for assistance with this.   Orders: -     Ambulatory referral to Psychiatry -     Ambulatory referral to Pain Clinic -     PRESCRIBED DRUGS,MEDMATCH(R) -     DRUG MONITOR, PANEL 4, W/CONF, URINE  Other orders -     DM TEMPLATE     Follow up plan: Return in about 3 weeks (around 01/06/2023).  John Meo, FNP

## 2022-12-18 LAB — DRUG MONITOR, PANEL 4, W/CONF, URINE
Alphahydroxyalprazolam: NEGATIVE ng/mL (ref <25)
Alphahydroxymidazolam: NEGATIVE ng/mL (ref <50)
Alphahydroxytriazolam: NEGATIVE ng/mL (ref <50)
Aminoclonazepam: 65 ng/mL — ABNORMAL HIGH (ref <25)
Amphetamines: NEGATIVE ng/mL (ref <500)
Barbiturates: NEGATIVE ng/mL (ref <300)
Benzodiazepines: POSITIVE ng/mL — AB (ref <100)
Cocaine Metabolite: NEGATIVE ng/mL (ref <150)
Creatinine: 62.1 mg/dL (ref >=20.0)
Hydroxyethylflurazepam: NEGATIVE ng/mL (ref <50)
Lorazepam: NEGATIVE ng/mL (ref <50)
Methadone Metabolite: NEGATIVE ng/mL (ref <100)
Nordiazepam: NEGATIVE ng/mL (ref <50)
Opiates: NEGATIVE ng/mL (ref <100)
Oxazepam: NEGATIVE ng/mL (ref <50)
Oxidant: NEGATIVE ug/mL (ref <200)
Oxycodone: NEGATIVE ng/mL (ref <100)
Phencyclidine: NEGATIVE ng/mL (ref <25)
Temazepam: NEGATIVE ng/mL (ref <50)
pH: 5.8 (ref 4.5–9.0)

## 2022-12-18 LAB — DM TEMPLATE

## 2022-12-18 LAB — PRESCRIBED DRUGS,MEDMATCH(R)

## 2023-01-03 ENCOUNTER — Ambulatory Visit: Payer: PRIVATE HEALTH INSURANCE | Admitting: Family Medicine

## 2023-01-03 ENCOUNTER — Telehealth: Payer: Self-pay

## 2023-01-03 ENCOUNTER — Encounter: Payer: Self-pay | Admitting: Family Medicine

## 2023-01-03 VITALS — BP 122/84 | HR 74 | Temp 98.4°F | Ht 70.0 in | Wt 170.5 lb

## 2023-01-03 DIAGNOSIS — F419 Anxiety disorder, unspecified: Secondary | ICD-10-CM | POA: Diagnosis not present

## 2023-01-03 DIAGNOSIS — F1320 Sedative, hypnotic or anxiolytic dependence, uncomplicated: Secondary | ICD-10-CM

## 2023-01-03 MED ORDER — CLONAZEPAM 0.25 MG PO TBDP
0.3750 mg | ORAL_TABLET | Freq: Every day | ORAL | 0 refills | Status: DC
Start: 2023-01-03 — End: 2023-01-25

## 2023-01-03 MED ORDER — CLONAZEPAM 0.5 MG PO TABS: 0.5000 mg | ORAL_TABLET | Freq: Every day | ORAL | 0 refills | Status: DC

## 2023-01-03 NOTE — Telephone Encounter (Signed)
Hi, Linda  Pls update on pt's referral [id number], for Psych. It was order on 12/16/22?  Thank you.

## 2023-01-03 NOTE — Assessment & Plan Note (Signed)
UDS with med match completed. He endorses anxiety and stress at home, would not like to try an alternative medication. Would like to continue trying to wean his Klonopin with my assistance. I have agreed to prescribe this for him short term with plans of safely weaning. Start 0.875mg  daily. Referral to psychiatry authorized and pending scheduling.

## 2023-01-03 NOTE — Assessment & Plan Note (Signed)
Well controlled on Clonazepam however he would like to continue trying to wean off of this. Reduce to 0.875mg  daily.

## 2023-01-03 NOTE — Progress Notes (Signed)
Subjective:  HPI: John Lozano is a 42 y.o. male presenting on 01/03/2023 for Follow-up   HPI Patient is in today for follow up for medication management and anxiety. He continues to attempt to wean from Clonazepam and is currently taking 1mg  nightly. He is taking 6mg  daily Suboxone and working to establish care with a medical provider for that. He would like to continue to wean and would not like to try another medication for anxiety such as SSRI. Symptoms include anxiety and headaches. He has quit drinking ETOH. Tylenol and Excedrin are ineffective.  Review of Systems  All other systems reviewed and are negative.   Relevant past medical history reviewed and updated as indicated.   Past Medical History:  Diagnosis Date   Asthma    GERD (gastroesophageal reflux disease)    Kidney stones    Open ankle fracture 11/13/2014     Past Surgical History:  Procedure Laterality Date   I & D EXTREMITY Right 11/13/2014   Procedure:  IRRIGATION AND DEBRIDEMENT RIGHT ANKLE, complex wound closure, placement of Vac, and splinting of right ankle ;  Surgeon: Venita Lick, MD;  [facility];  Service: Orthopedics;  Laterality: Right;   ORIF ANKLE FRACTURE Right 11/23/2014   Procedure: OPEN REDUCTION INTERNAL FIXATION (ORIF) RIGHT  BIMALLEOLAR ANKLE FRACTURE;  Surgeon: Toni Arthurs, MD;  [facility];  Service: Orthopedics;  Laterality: Right;   WISDOM TOOTH EXTRACTION      Allergies and medications reviewed and updated.   Current Outpatient Medications:    Buprenorphine HCl-Naloxone HCl 8-2 MG FILM, Place 6 mg of opioid under the tongue daily., Disp: , Rfl:    cetirizine (ZYRTEC) 10 MG tablet, Take 10 mg by mouth daily., Disp: , Rfl:    clonazePAM (KLONOPIN) 0.25 MG disintegrating tablet, Take 1.5 tablets (0.375 mg total) by mouth daily., Disp: 60 tablet, Rfl: 0   clonazePAM (KLONOPIN) 0.5 MG tablet, Take 1 tablet (0.5 mg total) by mouth daily., Disp: 30 tablet, Rfl: 0  Allergies   Allergen Reactions   Shellfish Allergy Swelling    Objective:   BP 122/84 (BP Location: Left Arm)   Pulse 74   Temp 98.4 F (36.9 C)   Ht 5\' 10"  (1.778 m)   Wt 170 lb 8 oz (77.3 kg)   SpO2 98%   BMI 24.46 kg/m      01/03/2023    4:17 PM 12/16/2022    3:01 PM 08/19/2022    2:53 PM  Vitals with BMI  Height 5\' 10"  5\' 10"  5\' 10"   Weight 170 lbs 8 oz 173 lbs 4 oz 170 lbs  BMI 24.46 24.86 24.39  Systolic 122 120 865  Diastolic 84 86 82  Pulse 74 77 69     Physical Exam Vitals and nursing note reviewed.  Constitutional:      Appearance: Normal appearance. He is normal weight.  HENT:     Head: Normocephalic and atraumatic.  Skin:    General: Skin is warm and dry.     Capillary Refill: Capillary refill takes less than 2 seconds.  Neurological:     General: No focal deficit present.     Mental Status: He is alert and oriented to person, place, and time. Mental status is at baseline.  Psychiatric:        Mood and Affect: Mood normal.        Behavior: Behavior normal.        Thought Content: Thought content normal.  Judgment: Judgment normal.     Assessment & Plan:  Anxiety Assessment & Plan: Well controlled on Clonazepam however he would like to continue trying to wean off of this. Reduce to 0.875mg  daily.   Klonopin use disorder, moderate, dependence (HCC) Assessment & Plan: UDS with med match completed. He endorses anxiety and stress at home, would not like to try an alternative medication. Would like to continue trying to wean his Klonopin with my assistance. I have agreed to prescribe this for him short term with plans of safely weaning. Start 0.875mg  daily. Referral to psychiatry authorized and pending scheduling.   Other orders -     clonazePAM; Take 1 tablet (0.5 mg total) by mouth daily.  Dispense: 30 tablet; Refill: 0 -     clonazePAM; Take 1.5 tablets (0.375 mg total) by mouth daily.  Dispense: 60 tablet; Refill: 0     Follow up plan: Return in  about 2 weeks (around 01/17/2023) for follow-up.  [name], FNP

## 2023-01-04 ENCOUNTER — Encounter: Payer: Self-pay | Admitting: Family Medicine

## 2023-01-04 ENCOUNTER — Telehealth: Payer: Self-pay

## 2023-01-04 ENCOUNTER — Telehealth: Payer: Self-pay | Admitting: Family Medicine

## 2023-01-04 ENCOUNTER — Other Ambulatory Visit: Payer: Self-pay | Admitting: Family Medicine

## 2023-01-04 NOTE — Telephone Encounter (Signed)
Already been taken care off previous msg. Spoke w/pt John Lozano sent the patient's Behavioral Health referral to the following at [facility] request.  The patient should call and make an appt:   [facility]  [address]  [place], [place]  [address]  Ph: [phone]

## 2023-01-04 NOTE — Telephone Encounter (Signed)
Copied from CRM [id number]. Topic: Referral - Status >> Jan 04, 2023 12:50 PM Fuller Mandril wrote: Reason for CRM: Pt called regarding referral for [facility] for pain. Pt states he called [facility] and they do not have a referral for him in the system. He wanted to make sure he had the correct place and that it had been submitted. He also wanted to see if there was any [facility] provider that would be in network that he could see regarding this referral.

## 2023-01-04 NOTE — Telephone Encounter (Signed)
Spoke w/pt aware, voiced understanding.   per Linda-referral stated per NP, Shirlee Limerick sent the patient's Behavioral Health referral to the following at Amber's request.  The patient should call and make an appt:   [facility]  [address]  [place], [place]  [address]  Ph: [phone]

## 2023-01-04 NOTE — Telephone Encounter (Signed)
Copied from CRM [id number]. Topic: Clinical - Prescription Issue >> Jan 04, 2023 12:53 PM Fuller Mandril wrote: Reason for CRM: Pt wanted provider to know that when he had Rx filled at [facility] they only had .5 of clonazePAM. They did not have .25 or 1.25.

## 2023-01-10 ENCOUNTER — Ambulatory Visit: Payer: 59 | Admitting: Family Medicine

## 2023-01-11 ENCOUNTER — Telehealth: Payer: Self-pay | Admitting: Family Medicine

## 2023-01-11 NOTE — Telephone Encounter (Signed)
Received a request from [facility] for an alternative requested for clonazepam 0.25mg   Pharmacy comments: alternative requested: not on formulary

## 2023-01-17 ENCOUNTER — Other Ambulatory Visit (HOSPITAL_BASED_OUTPATIENT_CLINIC_OR_DEPARTMENT_OTHER): Payer: Self-pay

## 2023-01-24 ENCOUNTER — Telehealth: Payer: Self-pay

## 2023-01-24 ENCOUNTER — Ambulatory Visit: Payer: 59 | Admitting: Family Medicine

## 2023-01-24 ENCOUNTER — Encounter: Payer: Self-pay | Admitting: Family Medicine

## 2023-01-24 VITALS — BP 120/70 | HR 96 | Temp 98.2°F | Ht 70.0 in | Wt 171.0 lb

## 2023-01-24 DIAGNOSIS — F419 Anxiety disorder, unspecified: Secondary | ICD-10-CM

## 2023-01-24 NOTE — Telephone Encounter (Signed)
Copied from CRM [id number]. Topic: Clinical - Prescription Issue >> Jan 24, 2023  3:37 PM Dennison Nancy wrote: Reason for CRM: patient insurance oscar with [place]  deny clonazePAM (KLONOPIN) 0.25 MG disintegrating tablet and the .15 patient request if provider can prescribe to get quantity 2 of the clonazePAM (KLONOPIN) 0.5 MG tablet  ,

## 2023-01-24 NOTE — Progress Notes (Signed)
Subjective:  HPI: John Lozano is a 42 y.o. male presenting on 01/24/2023 for No chief complaint on file.   HPI Patient is in today for anxiety follow up. His symptoms are well controlled on Klonopin 0.875mg  daily and he is working on weaning this. He does have an appt with psychiatry in 1 month. He is happy to report he is expecting a child in 7 months and has recently gotten engaged. Would like to continue weaning his Klonopin as tolerated. Denies SI/HI.  Review of Systems  All other systems reviewed and are negative.   Relevant past medical history reviewed and updated as indicated.   Past Medical History:  Diagnosis Date   Asthma    GERD (gastroesophageal reflux disease)    Kidney stones    Open ankle fracture 11/13/2014     Past Surgical History:  Procedure Laterality Date   I & D EXTREMITY Right 11/13/2014   Procedure:  IRRIGATION AND DEBRIDEMENT RIGHT ANKLE, complex wound closure, placement of Vac, and splinting of right ankle ;  Surgeon: Venita Lick, MD;  [facility];  Service: Orthopedics;  Laterality: Right;   ORIF ANKLE FRACTURE Right 11/23/2014   Procedure: OPEN REDUCTION INTERNAL FIXATION (ORIF) RIGHT  BIMALLEOLAR ANKLE FRACTURE;  Surgeon: Toni Arthurs, MD;  [facility];  Service: Orthopedics;  Laterality: Right;   WISDOM TOOTH EXTRACTION      Allergies and medications reviewed and updated.   Current Outpatient Medications:    Buprenorphine HCl-Naloxone HCl 8-2 MG FILM, Place 6 mg of opioid under the tongue daily., Disp: , Rfl:    cetirizine (ZYRTEC) 10 MG tablet, Take 10 mg by mouth daily., Disp: , Rfl:    clonazePAM (KLONOPIN) 0.25 MG disintegrating tablet, Take 1.5 tablets (0.375 mg total) by mouth daily., Disp: 60 tablet, Rfl: 0   clonazePAM (KLONOPIN) 0.5 MG tablet, Take 1 tablet (0.5 mg total) by mouth daily., Disp: 30 tablet, Rfl: 0  Allergies  Allergen Reactions   Shellfish Allergy Swelling    Objective:   BP 120/70   Pulse 96    Temp 98.2 F (36.8 C) (Oral)   Ht 5\' 10"  (1.778 m)   Wt 171 lb (77.6 kg)   SpO2 97%   BMI 24.54 kg/m      01/24/2023    2:57 PM 01/03/2023    4:17 PM 12/16/2022    3:01 PM  Vitals with BMI  Height 5\' 10"  5\' 10"  5\' 10"   Weight 171 lbs 170 lbs 8 oz 173 lbs 4 oz  BMI 24.54 24.46 24.86  Systolic 120 122 295  Diastolic 70 84 86  Pulse 96 74 77     Physical Exam Vitals and nursing note reviewed.  Constitutional:      Appearance: Normal appearance. He is normal weight.  HENT:     Head: Normocephalic and atraumatic.  Skin:    General: Skin is warm and dry.     Capillary Refill: Capillary refill takes less than 2 seconds.  Neurological:     General: No focal deficit present.     Mental Status: He is alert and oriented to person, place, and time. Mental status is at baseline.  Psychiatric:        Mood and Affect: Mood normal.        Behavior: Behavior normal.        Thought Content: Thought content normal.        Judgment: Judgment normal.     Assessment & Plan:  Anxiety  Assessment & Plan: GAD 4, PHQ 5. Well controlled on Clonazepam however he would like to continue trying to wean off of this. Continue 0.875mg  daily. Keep appts with behavioral health. Follow up in my office in 3 months or sooner if needed.      Follow up plan: Return in about 3 months (around 04/24/2023) for anxiety/depression.  [name], FNP

## 2023-01-24 NOTE — Assessment & Plan Note (Addendum)
GAD 4, PHQ 5. Well controlled on Clonazepam however he would like to continue trying to wean off of this. Continue 0.875mg  daily. Keep appts with behavioral health. Follow up in my office in 3 months or sooner if needed.

## 2023-01-25 ENCOUNTER — Other Ambulatory Visit: Payer: Self-pay | Admitting: Family Medicine

## 2023-01-25 MED ORDER — CLONAZEPAM 0.5 MG PO TABS
1.0000 mg | ORAL_TABLET | Freq: Every day | ORAL | 0 refills | Status: DC
Start: 2023-01-25 — End: 2023-02-01

## 2023-02-01 ENCOUNTER — Other Ambulatory Visit: Payer: Self-pay | Admitting: Family Medicine

## 2023-02-01 MED ORDER — CLONAZEPAM 0.5 MG PO TABS
1.0000 mg | ORAL_TABLET | Freq: Every day | ORAL | 0 refills | Status: DC
Start: 2023-02-01 — End: 2023-03-30

## 2023-02-19 NOTE — Progress Notes (Deleted)
 Psychiatric Initial Adult Assessment  Patient Identification: John Lozano MRN:  [id number] Date of Evaluation:  02/19/2023 Referral Source: Kayla Jeoffrey RAMAN, FNP  Assessment:  John Lozano is a 43 y.o. male with a history of opiate use disorder, sedative use disorder, asthma, erectile dysfunction who presents in person to [facility] for medication management. Patient is on clonazepam  0.875 mg daily for management of depression and anxiety but is working to wean off with PCP. Used to drink alcohol 3 beers daily and formerly on suboxone .   He did not want to be started on a SSRI.   Patient reports ***  Substance Use Depression   Plan:  #  Past medication trials:  Status of problem: *** Interventions: -- decrease clonazepam  from 0.875 mg to *** SSRI  # *** Past medication trials:  Status of problem: *** Interventions: -- ***  # *** Past medication trials:  Status of problem: *** Interventions: -- ***  Return to care in ***  Patient was given contact information for behavioral health clinic and was instructed to call 911 for emergencies.    Patient and plan of care will be discussed with the Attending MD, Dr. ***, who agrees with the above statement and plan.   Subjective:  Chief Complaint: Medication Management  History of Present Illness:  ***  Past Psychiatric History:  Diagnoses: *** Medication trials: *** Previous psychiatrist/therapist: *** Hospitalizations: *** Suicide attempts: *** SIB: *** Hx of violence towards others: *** Current access to guns: *** Hx of trauma/abuse: ***  Substance Abuse History in the last 12 months:  {yes no:314532}  Past Medical History:  Past Medical History:  Diagnosis Date   Asthma    GERD (gastroesophageal reflux disease)    Kidney stones    Open ankle fracture 11/13/2014    Past Surgical History:  Procedure Laterality Date   I & D EXTREMITY Right 11/13/2014   Procedure:   IRRIGATION AND DEBRIDEMENT RIGHT ANKLE, complex wound closure, placement of Vac, and splinting of right ankle ;  Surgeon: Donaciano Sprang, MD;  [facility]: [facility];  Service: Orthopedics;  Laterality: Right;   ORIF ANKLE FRACTURE Right 11/23/2014   Procedure: OPEN REDUCTION INTERNAL FIXATION (ORIF) RIGHT  BIMALLEOLAR ANKLE FRACTURE;  Surgeon: Norleen Armor, MD;  [facility]: [facility];  Service: Orthopedics;  Laterality: Right;   WISDOM TOOTH EXTRACTION      Family Psychiatric History: ***  Family History:  Family History  Problem Relation Age of Onset   Healthy Mother    Healthy Father     Social History:   Academic/Vocational: *** Social History   Socioeconomic History   Marital status: Single    Spouse name: Not on file   Number of children: Not on file   Years of education: Not on file   Highest education level: Not on file  Occupational History   Not on file  Tobacco Use   Smoking status: Every Day    Types: E-cigarettes   Smokeless tobacco: Never  Substance and Sexual Activity   Alcohol use: Not Currently    Alcohol/week: 4.0 standard drinks of alcohol    Types: 4 Cans of beer per week    Comment: occasional drink   Drug use: Not Currently    Types: Benzodiazepines    Comment: clonazepam    Sexual activity: Yes    Partners: Female    Birth control/protection: Condom    Comment: 1 monogamous  Other Topics Concern   Not on file  Social History  Narrative   Not on file   Social Drivers of Health   Financial Resource Strain: Not on file  Food Insecurity: Not on file  Transportation Needs: Not on file  Physical Activity: Not on file  Stress: Not on file  Social Connections: Not on file    Additional Social History: updated  Allergies:   Allergies  Allergen Reactions   Shellfish Allergy Swelling    Current Medications: Current Outpatient Medications  Medication Sig Dispense Refill   Buprenorphine  HCl-Naloxone HCl 8-2 MG FILM Place 6 mg of opioid under the tongue  daily.     cetirizine (ZYRTEC) 10 MG tablet Take 10 mg by mouth daily.     clonazePAM  (KLONOPIN ) 0.5 MG tablet Take 2 tablets (1 mg total) by mouth daily. 60 tablet 0   No current facility-administered medications for this visit.    ROS: Review of Systems ***  Objective:  Psychiatric Specialty Exam:  There were no vitals taken for this visit.There is no height or weight on file to calculate BMI.  General Appearance: {Appearance:22683}  Eye Contact:  {BHH EYE CONTACT:22684}  Speech:  {Speech:22685}  Volume:  {Volume (PAA):22686}  Mood:  {BHH MOOD:22306}  Affect:  {Affect (PAA):22687}  Thought Content: {Thought Content:22690}   Suicidal Thoughts:  {ST/HT (PAA):22692}  Homicidal Thoughts:  {ST/HT (PAA):22692}  Thought Process:  {Thought Process (PAA):22688}  Orientation:  {BHH ORIENTATION (PAA):22689}  Judgment:  {Judgement (PAA):22694}  Insight:  {Insight (PAA):22695}  Concentration:  {Concentration:21399}  Fund of Knowledge: {BHH GOOD/FAIR/POOR:22877}  Language: {BHH GOOD/FAIR/POOR:22877}  Psychomotor Activity:  {Psychomotor (PAA):22696}  Akathisia:  {BHH YES OR NO:22294}  AIMS (if indicated): {Desc; done/not:10129}  Assets:  {Assets (PAA):22698}  ADL's:  {BHH JIO'D:77709}  Cognition: {chl bhh cognition:304700322}      PE: General: well-appearing; no acute distress *** Pulm: no increased work of breathing on room air *** Strength & Muscle Tone: {desc; muscle tone:32375} Neuro: no focal neurological deficits observed *** Gait & Station: {PE GAIT ED WJUO:77474}   Screenings:  GAD-7    Flowsheet Row Office Visit from 01/24/2023 in [facility] Office Visit from 01/03/2023 in [facility] Office Visit from 12/16/2022 in [facility] Office Visit from 08/19/2022 in [facility] Office Visit from 05/17/2022 in [facility]  Total GAD-7  Score 4 4 6  0 3      PHQ2-9    Flowsheet Row Office Visit from 01/24/2023 in [facility] [place] [facility] Office Visit from 01/03/2023 in [facility] Office Visit from 12/16/2022 in [facility] Office Visit from 08/19/2022 in [facility] Office Visit from 05/17/2022 in [facility]  PHQ-2 Total Score 0 0 2 0 0  PHQ-9 Total Score 5 2 5 5 5       Flowsheet Row ED from 09/11/2021 in [facility] RISK CATEGORY No Risk        Prentice Espy, MD 1/11/202512:33 PM

## 2023-02-21 ENCOUNTER — Ambulatory Visit (HOSPITAL_COMMUNITY): Payer: 59 | Admitting: Student

## 2023-02-22 NOTE — Progress Notes (Signed)
 Psychiatric Initial Adult Assessment  Patient Identification: John Lozano MRN:  [id number] Date of Evaluation:  02/22/2023 Referral Source: Jenelle Mis, FNP  Assessment:  John Lozano is a 43 y.o. male with a history of opiate use disorder, sedative use disorder, asthma, erectile dysfunction, chronic hepatitis C who presents in person to [facility] for medication management. Patient is on clonazepam  1 mg daily for management of depression and anxiety but is working to wean off with PCP. Used to drink alcohol 3 beers daily but has abstained since end of 2024. He is currently on suboxone  6-1.75.  He has had difficulty with ongoing taper off clonazepam  due to multiple somatic symptoms as well as worsening anxiety.  We discussed managing some of his generalized anxiety with SSRI given that with his original reason to start using clonazepam  and opiates. He was amenable to starting sertraline  at low-dose with gradual increase given not having been on a SSRI in the past.  Hydroxyzine  and trazodone  were ordered for anxiety and sleep as needed for anxiety and insomnia.  Patient will also be undergoing psychotherapy with me every other week once able to start. Patient to follow-up with me from medication management in one month. Plan to also manage clonazepam  taper starting February.  Should patient not be able to tolerate clonazepam  taper, plan to convert to Valium  taper via [facility] protocol. Should he need additional support given his substance use history, he can be referred to [facility].   Plan:  # Generalized anxiety disorder Past medication trials: clonazepam  Interventions: -- clonazepam  0.5 mg bid -- start sertraline  25 mg for 2 weeks then INCREASE to 50 mg --Start hydroxyzine  25 mg 3 times daily as needed for anxiety  # Benzodiazepine dependence Past medication trials:  Interventions: -- Clonazepam  0.5 mg twice daily --Start trazodone  25-50 mg nightly as  needed for insomnia  # Opioid Use Disorder, in sustained remission -- defer management of suboxone  per PCP  # Alcohol Use Disorder, in early remission -- consider naltrexone if alcohol craving worsens  Return to care in 1 month  Patient was given contact information for behavioral health clinic and was instructed to call 911 for emergencies.    Patient and plan of care will be discussed with the Attending MD, Dr. Sharalyn Dasen, who agrees with the above statement and plan.   Subjective:  Chief Complaint: Medication Management  History of Present Illness:   Patient presents primarily at the request of PCP due to ongoing difficulties with clonazepam  dependence and tapering.  He reports he has begun tapering several months ago and has found it progressively more difficult at lower doses of clonazepam .  He endorses symptoms of worsening anxiety, sporadic headaches, insomnia, and myalgia.  He will also reports ongoing psychosocial stressors including recently getting engaged, fiance pregnant, moving fiancee into current household, wedding planning, father who has history of substance use, and financial stressors.  He reports starting clonazepam  several years ago after trying friend's clonazepam  and had been illicitly obtaining it for a period of time. He is motivated to taper off this medication as he wants to be more financially responsible and has a newborn on the way. Other substance use includes former history of opioid IVDU. In addition, he reports daily alcohol use (6 pack of beer) until a few months ago when his fiancee found out she was pregnant and both opted to discontinue alcohol use.   He reports never experiencing significant or persistent depressed mood or anhedonia.  However, he  does report significant generalized anxiety that has resulted in sporadic panic attacks.  This had especially worsened when his grandfather passed away several years ago which he reports was a major stressor  resulting in his substance use. He denies present SI/HI/AVH.   He denies history of mania or psychosis. He denies history of trauma.   Past Psychiatric History:  Diagnoses: Benzodiazepine dependence, generalized anxiety disorder Medication trials: Clonazepam  Previous psychiatrist/therapist: None Hospitalizations: Denies Suicide attempts: Denies SIB: Denies Hx of violence towards others: Denies Hx of trauma/abuse: denies  Substance Abuse History in the last 12 months:  Yes.    Past Medical History:  Past Medical History:  Diagnosis Date   Asthma    GERD (gastroesophageal reflux disease)    Kidney stones    Open ankle fracture 11/13/2014    Past Surgical History:  Procedure Laterality Date   I & D EXTREMITY Right 11/13/2014   Procedure:  IRRIGATION AND DEBRIDEMENT RIGHT ANKLE, complex wound closure, placement of Vac, and splinting of right ankle ;  Surgeon: Mort Ards, MD;  Location: [facility] OR;  Service: Orthopedics;  Laterality: Right;   ORIF ANKLE FRACTURE Right 11/23/2014   Procedure: OPEN REDUCTION INTERNAL FIXATION (ORIF) RIGHT  BIMALLEOLAR ANKLE FRACTURE;  Surgeon: Amada Backer, MD;  Location: [facility] OR;  Service: Orthopedics;  Laterality: Right;   WISDOM TOOTH EXTRACTION      Family Psychiatric History: father significant substance use  Family History:  Family History  Problem Relation Age of Onset   Healthy Mother    Healthy Father     Social History:   Academic/Vocational: Metallurgist Social History   Socioeconomic History   Marital status: Single    Spouse name: Not on file   Number of children: Not on file   Years of education: Not on file   Highest education level: Not on file  Occupational History   Not on file  Tobacco Use   Smoking status: Every Day    Types: E-cigarettes   Smokeless tobacco: Never  Substance and Sexual Activity   Alcohol use: Not Currently    Alcohol/week: 4.0 standard drinks of alcohol    Types: 4 Cans of beer per week     Comment: occasional drink   Drug use: Not Currently    Types: Benzodiazepines    Comment: clonazepam    Sexual activity: Yes    Partners: Female    Birth control/protection: Condom    Comment: 1 monogamous  Other Topics Concern   Not on file  Social History Narrative   Not on file   Social Drivers of Health   Financial Resource Strain: Not on file  Food Insecurity: Not on file  Transportation Needs: Not on file  Physical Activity: Not on file  Stress: Not on file  Social Connections: Not on file    Additional Social History: updated  Allergies:   Allergies  Allergen Reactions   Shellfish Allergy Swelling    Current Medications: Current Outpatient Medications  Medication Sig Dispense Refill   Buprenorphine  HCl-Naloxone HCl 8-2 MG FILM Place 6 mg of opioid under the tongue daily.     cetirizine (ZYRTEC) 10 MG tablet Take 10 mg by mouth daily.     clonazePAM  (KLONOPIN ) 0.5 MG tablet Take 2 tablets (1 mg total) by mouth daily. 60 tablet 0   No current facility-administered medications for this visit.    ROS: Review of Systems   Objective:  Psychiatric Specialty Exam:  There were no vitals taken for this visit.There is  no height or weight on file to calculate BMI.  General Appearance: Casual  Eye Contact:  Good  Speech:  Clear and Coherent and Normal Rate  Volume:  Normal  Mood:  Anxious  Affect:  Depressed  Thought Content: Logical   Suicidal Thoughts:  No  Homicidal Thoughts:  No  Thought Process:  Coherent, Goal Directed, and Linear  Orientation:  Full (Time, Place, and Person)  Judgment:  Intact  Insight:  Fair  Concentration:  Concentration: Fair  Fund of Knowledge: Fair  Language: Fair  Psychomotor Activity:  Normal  Akathisia:  No  AIMS (if indicated): not done  Assets:  Communication Skills Desire for Improvement Financial Resources/Insurance Housing Intimacy Leisure Time Physical Health Resilience Social  Support Talents/Skills Transportation Vocational/Educational  ADL's:  Intact  Cognition: WNL      PE: General: well-appearing; no acute distress  Pulm: no increased work of breathing on room air  Strength & Muscle Tone: within normal limits Neuro: no focal neurological deficits observed  Gait & Station: normal   Screenings:  GAD-7    Loss adjuster, chartered Office Visit from 01/24/2023 in [facility] Office Visit from 01/03/2023 in [facility] Office Visit from 12/16/2022 in [facility] Office Visit from 08/19/2022 in [facility] Office Visit from 05/17/2022 in [facility]  Total GAD-7 Score 4 4 6  0 3      PHQ2-9    Flowsheet Row Office Visit from 01/24/2023 in [facility] Office Visit from 01/03/2023 in [facility] Office Visit from 12/16/2022 in [facility] Office Visit from 08/19/2022 in [facility] Office Visit from 05/17/2022 in [facility]  PHQ-2 Total Score 0 0 2 0 0  PHQ-9 Total Score 5 2 5 5 5       Flowsheet Row ED from 09/11/2021 in [facility] RISK CATEGORY No Risk        Augusta Blizzard, MD 1/14/20255:06 PM

## 2023-02-23 ENCOUNTER — Ambulatory Visit (HOSPITAL_BASED_OUTPATIENT_CLINIC_OR_DEPARTMENT_OTHER): Payer: PRIVATE HEALTH INSURANCE | Admitting: Student

## 2023-02-23 DIAGNOSIS — F411 Generalized anxiety disorder: Secondary | ICD-10-CM | POA: Diagnosis not present

## 2023-02-23 MED ORDER — TRAZODONE HCL 50 MG PO TABS: 25.0000 mg | ORAL_TABLET | Freq: Every evening | ORAL | 0 refills | Status: DC | PRN

## 2023-02-23 MED ORDER — SERTRALINE HCL 25 MG PO TABS: ORAL_TABLET | ORAL | 0 refills | Status: DC

## 2023-02-23 MED ORDER — HYDROXYZINE HCL 25 MG PO TABS: 25.0000 mg | ORAL_TABLET | Freq: Three times a day (TID) | ORAL | 0 refills | Status: DC | PRN

## 2023-02-25 ENCOUNTER — Encounter (HOSPITAL_COMMUNITY): Payer: Self-pay | Admitting: Student

## 2023-02-25 NOTE — Addendum Note (Signed)
Addended by: Everlena Cooper on: 02/25/2023 05:02 PM   Modules accepted: Level of Service

## 2023-03-17 ENCOUNTER — Telehealth (HOSPITAL_COMMUNITY): Payer: Self-pay | Admitting: *Deleted

## 2023-03-17 DIAGNOSIS — F411 Generalized anxiety disorder: Secondary | ICD-10-CM

## 2023-03-17 MED ORDER — SERTRALINE HCL 50 MG PO TABS: 50.0000 mg | ORAL_TABLET | Freq: Every day | ORAL | 0 refills | Status: DC

## 2023-03-17 MED ORDER — TRAZODONE HCL 50 MG PO TABS: 25.0000 mg | ORAL_TABLET | Freq: Every evening | ORAL | 0 refills | Status: DC | PRN

## 2023-03-17 MED ORDER — HYDROXYZINE HCL 25 MG PO TABS: 25.0000 mg | ORAL_TABLET | Freq: Three times a day (TID) | ORAL | 0 refills | Status: DC | PRN

## 2023-03-17 NOTE — Telephone Encounter (Signed)
 Received refill request from pt [facility] for the Trazodone , Atarax , and Sertraline . Request is for 90 day supply on all. Pt has an office visit scheduled for 03/27/23, and a psychotherapy appointment on 03/21/23. Please review.

## 2023-03-17 NOTE — Telephone Encounter (Signed)
 Refill reviewed and 90 day supplies of medications have been sent to preferred pharmacy.

## 2023-03-20 NOTE — Progress Notes (Signed)
[facility] [address] [place] [address] Dept: [phone] Dept Fax: [phone]  Psychotherapy Progress Note  Patient ID: John Lozano, male  DOB: 1980-03-01, 43 y.o.  MRN: [id number]  03/21/2023 Start time: 2:00 PM End time: 3:00 PM  Method of Visit: Face-to-Face  Present: patient  Current Concerns:  How to manage multiple stressors effectively without feeling need to procrastinate How to manage anxiety and root causes of the anxiety of having first child Identify areas in patient's past that anxiety may have stemmed from and educate on how self-medication through substance use had happened  We established a list of aspects Scotty would like to look into. Concerns/goals of treatment primarily based in anxiety that preceded any form of substance use. We discussed procrastination and the nature of emotional dysregulation related to it. We discussed how much more pervasive his anxiety may have contributed to his lifestyle and a variety aspect of his life. We also touched on the future and why he is experiencing significant anxiety once his son is born.   Current Symptoms: Anxiety  Psychiatric Specialty Exam: General Appearance: Casual  Eye Contact:  Good  Speech:  Clear and Coherent and Normal Rate  Volume:  Normal  Mood:  Anxious  Affect:  Appropriate and Congruent  Thought Process:  Coherent, Goal Directed, and Linear  Orientation:  Full (Time, Place, and Person)  Thought Content:  Logical  Suicidal Thoughts:  No  Homicidal Thoughts:  No  Memory:  Remote;   Fair  Judgement:  Intact  Insight:  Fair  Psychomotor Activity:  Normal  Concentration:  Concentration: Fair  Recall:  Good  Fund of Knowledge:Good  Language: Good  Akathisia:  No    AIMS (if indicated):  not done  Assets:  Communication Skills Desire for Improvement Financial  Resources/Insurance Housing Intimacy Leisure Time Physical Health Resilience Social Support Talents/Skills Transportation  ADL's:  Intact  Cognition: WNL  Sleep:  Good     Diagnosis: Generalized Anxiety Disorder, Benzodiazepine dependence  Anticipated Frequency of Visits: every other week Anticipated Length of Treatment Episode: 20      03/21/23 2118  Anxiety Long Term Goals  Anxiety Long Term Goals Stabilize anxiety level while increasing ability to function on a daily basis;Resolve the core conflict that is the source of anxiety  Anxiety Short Term Goals  Anxiety Short Term Goals Identify the major life conflicts from the past and present that form the basis for present anxiety;Verbalize an understanding of how thoughts, physical feelings, and behavioral actions contribute to anxiety and it's treatment,  Identify the major life conflicts from the past and present that form the basis for present anxiety Ask the client to develop and process a list of key past and present life conflicts that continue to cause worry  Strategies to reduce or eliminate the irrational anxiety Develop behavioral and cognitive strategies to reduce or eliminate the irrational anxiety;Identify and use specific coping strategies for anxiety reduction  Increase participation in daily social, academic and vocational activities Strengthen his/her new non avoidant approach and build confidence  Participate in Cognitive Behavioral group treatment for anxiety To learn about anxiety  Anxiety Treatment Plan Status  Anxiety Treatment Plan Status Initial     Treatment Intervention: Cognitive Behavioral therapy and Psychoeducation  Medical Necessity: Improved patient condition  Assessment Tools:    01/24/2023    3:34 PM 01/03/2023    4:43 PM 01/03/2023    4:18 PM  Depression  screen PHQ 2/9  Decreased Interest 0 0 0  Down, Depressed, Hopeless 0 0 0  PHQ - 2 Score 0 0 0  Altered sleeping 3 1 1   Tired,  decreased energy 1 1 1   Change in appetite 1 0 0  Feeling bad or failure about yourself  0 0 0  Trouble concentrating 0 0 0  Moving slowly or fidgety/restless 0 0 0  Suicidal thoughts 0 0 0  PHQ-9 Score 5 2 2   Difficult doing work/chores Not difficult at all Not difficult at all Not difficult at all   Failed to redirect to the Timeline version of the REVFS SmartLink. Flowsheet Row ED from 09/11/2021 in [facility] RISK CATEGORY No Risk       Collaboration of Care:   Patient/Guardian was advised Release of Information must be obtained prior to any record release in order to collaborate their care with an outside provider. Patient/Guardian was advised if they have not already done so to contact the registration department to sign all necessary forms in order for [place] to release information regarding their care.   Consent: Patient/Guardian gives verbal consent for treatment and assignment of benefits for services provided during this visit. Patient/Guardian expressed understanding and agreed to proceed.   Plan: Continue psychotherapy. Likely will be a mix of psychodynamic psychotherapy, supportive psychotherapy, and elements of CBT. Will explore which therapeutic option is most effective for patient and proceed from there.  Park Pope, MD 03/21/2023

## 2023-03-21 ENCOUNTER — Ambulatory Visit (HOSPITAL_BASED_OUTPATIENT_CLINIC_OR_DEPARTMENT_OTHER): Payer: PRIVATE HEALTH INSURANCE | Admitting: Student

## 2023-03-21 ENCOUNTER — Encounter (HOSPITAL_COMMUNITY): Payer: Self-pay | Admitting: Student

## 2023-03-21 DIAGNOSIS — F411 Generalized anxiety disorder: Secondary | ICD-10-CM | POA: Diagnosis not present

## 2023-03-21 NOTE — Progress Notes (Signed)
   03/21/23 2118  Anxiety Long Term Goals  Anxiety Long Term Goals Stabilize anxiety level while increasing ability to function on a daily basis;Resolve the core conflict that is the source of anxiety  Anxiety Short Term Goals  Anxiety Short Term Goals Identify the major life conflicts from the past and present that form the basis for present anxiety;Verbalize an understanding of how thoughts, physical feelings, and behavioral actions contribute to anxiety and it's treatment,  Identify the major life conflicts from the past and present that form the basis for present anxiety Ask the client to develop and process a list of key past and present life conflicts that continue to cause worry  Strategies to reduce or eliminate the irrational anxiety Develop behavioral and cognitive strategies to reduce or eliminate the irrational anxiety;Identify and use specific coping strategies for anxiety reduction  Increase participation in daily social, academic and vocational activities Strengthen his/her new non avoidant approach and build confidence  Participate in Cognitive Behavioral group treatment for anxiety To learn about anxiety  Anxiety Treatment Plan Status  Anxiety Treatment Plan Status Initial

## 2023-03-28 ENCOUNTER — Ambulatory Visit: Payer: 59 | Admitting: Family Medicine

## 2023-03-30 ENCOUNTER — Encounter (HOSPITAL_COMMUNITY): Payer: Self-pay | Admitting: Student

## 2023-03-30 ENCOUNTER — Ambulatory Visit (HOSPITAL_COMMUNITY): Payer: PRIVATE HEALTH INSURANCE | Admitting: Student

## 2023-03-30 ENCOUNTER — Telehealth (HOSPITAL_BASED_OUTPATIENT_CLINIC_OR_DEPARTMENT_OTHER): Payer: PRIVATE HEALTH INSURANCE | Admitting: Student

## 2023-03-30 DIAGNOSIS — F1191 Opioid use, unspecified, in remission: Secondary | ICD-10-CM | POA: Diagnosis not present

## 2023-03-30 DIAGNOSIS — F411 Generalized anxiety disorder: Secondary | ICD-10-CM | POA: Diagnosis not present

## 2023-03-30 DIAGNOSIS — F1091 Alcohol use, unspecified, in remission: Secondary | ICD-10-CM

## 2023-03-30 DIAGNOSIS — F1320 Sedative, hypnotic or anxiolytic dependence, uncomplicated: Secondary | ICD-10-CM | POA: Diagnosis not present

## 2023-03-30 MED ORDER — DIAZEPAM 10 MG PO TABS: 10.0000 mg | ORAL_TABLET | Freq: Every day | ORAL | 0 refills | Status: AC

## 2023-03-30 NOTE — Progress Notes (Signed)
Psychiatric Follow Up Adult Assessment  Patient Identification: John Lozano MRN:  [id number] Referral Source: [name], FNP  Televisit via video: I connected with [name] on 03/30/23 at  1:30 PM EST by a video enabled telemedicine application and verified that I am speaking with the correct person using two identifiers.  Location: Patient: home Provider: office   I discussed the limitations of evaluation and management by telemedicine and the availability of in person appointments. The patient expressed understanding and agreed to proceed.  I discussed the assessment and treatment plan with the patient. The patient was provided an opportunity to ask questions and all were answered. The patient agreed with the plan and demonstrated an understanding of the instructions.   The patient was advised to call back or seek an in-person evaluation if the symptoms worsen or if the condition fails to improve as anticipated.   Assessment:  John Lozano is a 43 y.o. male with a history of opiate use disorder, sedative use disorder, asthma, erectile dysfunction, chronic hepatitis C who presents in person to [facility] for medication management. Patient has been on daily clonazepam and suboxone for management of GAD and OUD respectively. He was started on a SSRI with significant improvement in anxiety. He continues to be on clonazepam 0.5 mg bid as he had missed last PCP visit. Patient has opted to proceed with taper via Phineas Semen protocol given the severity of his rebound anxiety. Patient's clonazepam was converted to valium 10 mg daily to assess for tolerability of valium before beginning taper. Plan to check in with patient in ~2 weeks and begin taper at that point. He has been using trazodone at 25 mg to sleep as he has felt daytime sedation when he took 50 mg. Sertraline had been titrated to 50 mg with no notable somatic complaints and feels his  residual anxiety is manageable. Hydroxyzine has been effective as PRN anxiolytic. No major alcohol cravings reported and PCP continues to manage his suboxone for OUD. Patient to continue psychotherapy with me. Plan to go over [place] protocol in detail next visit.   Plan:  # Generalized anxiety disorder Past medication trials: clonazepam (dependence) Interventions: -- continue sertraline 50 mg -- continue hydroxyzine 25 mg 3 times daily as needed for anxiety  # Benzodiazepine dependence Past medication trials: clonazepam (rebound anxiety at lower doses) Interventions: -- switch from clonazepam to valium 10 mg daily --Continue trazodone 25 mg nightly as needed for insomnia  # Opioid Use Disorder, in sustained remission -- defer management of suboxone per PCP  # Alcohol Use Disorder, in early remission -- last use: 12/2022 -- consider naltrexone if alcohol craving worsens  Return to care in 2 weeks  Patient was given contact information for behavioral health clinic and was instructed to call 911 for emergencies.    Patient and plan of care will be discussed with the Attending MD, Dr. Mercy Riding, who agrees with the above statement and plan.   Subjective:  Chief Complaint: Medication Management  Interval History: Patient presents virtually for follow up appointment.  Patient reports that he has been overall doing better since last visit after initiation of sertraline.  He reports that his anxiety has been better controlled recently and feels more confident in his ability to taper off benzodiazepine.  He denies any feelings of depression or anhedonia.  He reports struggling with continuing taper with clonazepam and would like to opt for conversion to Valium and tapering from there.  He  denies present SI/HI/AVH.  Reports eating and sleeping well.  He will sporadically use trazodone 25 mg due to him having some daytime sedation when he was using it at 50 mg.  He also reports using as needed  hydroxyzine at times when he noticed significant anxiety.  He denies any acute alcohol craving.  He feels that his opiate use disorder is currently well-controlled with Suboxone.  He would like to continue psychotherapy in order to better manage his anxiety and recent changes to his life.  Past Psychiatric History:  Diagnoses: Benzodiazepine dependence, generalized anxiety disorder Medication trials: Clonazepam, trazodone,  Previous psychiatrist/therapist: None Hospitalizations: Denies Suicide attempts: Denies SIB: Denies Hx of violence towards others: Denies Hx of trauma/abuse: denies  Substance Abuse History in the last 12 months:  Yes.    Past Medical History:  Past Medical History:  Diagnosis Date   Asthma    GERD (gastroesophageal reflux disease)    Kidney stones    Open ankle fracture 11/13/2014    Past Surgical History:  Procedure Laterality Date   I & D EXTREMITY Right 11/13/2014   Procedure:  IRRIGATION AND DEBRIDEMENT RIGHT ANKLE, complex wound closure, placement of Vac, and splinting of right ankle ;  Surgeon: Venita Lick, MD;  Location: [facility] OR;  Service: Orthopedics;  Laterality: Right;   ORIF ANKLE FRACTURE Right 11/23/2014   Procedure: OPEN REDUCTION INTERNAL FIXATION (ORIF) RIGHT  BIMALLEOLAR ANKLE FRACTURE;  Surgeon: Toni Arthurs, MD;  Location: [facility] OR;  Service: Orthopedics;  Laterality: Right;   WISDOM TOOTH EXTRACTION      Family Psychiatric History: father significant substance use  Family History:  Family History  Problem Relation Age of Onset   Healthy Mother    Healthy Father     Social History:   Academic/Vocational: Metallurgist Social History   Socioeconomic History   Marital status: Single    Spouse name: Not on file   Number of children: Not on file   Years of education: Not on file   Highest education level: Not on file  Occupational History   Not on file  Tobacco Use   Smoking status: Every Day    Types: E-cigarettes    Smokeless tobacco: Never  Substance and Sexual Activity   Alcohol use: Not Currently    Alcohol/week: 4.0 standard drinks of alcohol    Types: 4 Cans of beer per week    Comment: occasional drink   Drug use: Not Currently    Types: Benzodiazepines    Comment: clonazepam   Sexual activity: Yes    Partners: Female    Birth control/protection: Condom    Comment: 1 monogamous  Other Topics Concern   Not on file  Social History Narrative   Not on file   Social Drivers of Health   Financial Resource Strain: Not on file  Food Insecurity: Not on file  Transportation Needs: Not on file  Physical Activity: Not on file  Stress: Not on file  Social Connections: Not on file    Additional Social History: updated  Allergies:   Allergies  Allergen Reactions   Shellfish Allergy Swelling    Current Medications: Current Outpatient Medications  Medication Sig Dispense Refill   Buprenorphine HCl-Naloxone HCl 8-2 MG FILM Place 6 mg of opioid under the tongue daily.     cetirizine (ZYRTEC) 10 MG tablet Take 10 mg by mouth daily.     clonazePAM (KLONOPIN) 0.5 MG tablet Take 2 tablets (1 mg total) by mouth daily. 60 tablet 0  hydrOXYzine (ATARAX) 25 MG tablet Take 1 tablet (25 mg total) by mouth 3 (three) times daily as needed. 270 tablet 0   sertraline (ZOLOFT) 50 MG tablet Take 1 tablet (50 mg total) by mouth daily. 90 tablet 0   traZODone (DESYREL) 50 MG tablet Take 0.5-1 tablets (25-50 mg total) by mouth at bedtime as needed for sleep. 90 tablet 0   No current facility-administered medications for this visit.    ROS: Review of Systems   Objective:  Psychiatric Specialty Exam:  There were no vitals taken for this visit.There is no height or weight on file to calculate BMI.  General Appearance: Casual  Eye Contact:  Good  Speech:  Clear and Coherent and Normal Rate  Volume:  Normal  Mood:  Anxious  Affect:  Depressed  Thought Content: Logical   Suicidal Thoughts:  No   Homicidal Thoughts:  No  Thought Process:  Coherent, Goal Directed, and Linear  Orientation:  Full (Time, Place, and Person)  Judgment:  Intact  Insight:  Fair  Concentration:  Concentration: Fair  Fund of Knowledge: Fair  Language: Fair  Psychomotor Activity:  Normal  Akathisia:  No  AIMS (if indicated): not done  Assets:  Communication Skills Desire for Improvement Financial Resources/Insurance Housing Intimacy Leisure Time Physical Health Resilience Social Support Talents/Skills Transportation Vocational/Educational  ADL's:  Intact  Cognition: WNL      PE: General: well-appearing; no acute distress  Pulm: no increased work of breathing on room air  Strength & Muscle Tone: within normal limits Neuro: no focal neurological deficits observed  Gait & Station: normal   Screenings:  GAD-7    Loss adjuster, chartered Office Visit from 01/24/2023 in [facility] Office Visit from 01/03/2023 in [facility] [place] [facility] Office Visit from 12/16/2022 in [facility] Office Visit from 08/19/2022 in [facility] Office Visit from 05/17/2022 in [facility]  Total GAD-7 Score 4 4 6  0 3      PHQ2-9    Flowsheet Row Office Visit from 01/24/2023 in [facility] [place] [facility] Office Visit from 01/03/2023 in [facility] Office Visit from 12/16/2022 in [facility] Office Visit from 08/19/2022 in [facility] Office Visit from 05/17/2022 in [facility]  PHQ-2 Total Score 0 0 2 0 0  PHQ-9 Total Score 5 2 5 5 5       Flowsheet Row ED from 09/11/2021 in [facility] RISK CATEGORY No Risk        Park Pope, MD 2/19/20251:30 PM

## 2023-04-13 ENCOUNTER — Ambulatory Visit (HOSPITAL_BASED_OUTPATIENT_CLINIC_OR_DEPARTMENT_OTHER): Payer: PRIVATE HEALTH INSURANCE | Admitting: Student

## 2023-04-13 DIAGNOSIS — F411 Generalized anxiety disorder: Secondary | ICD-10-CM | POA: Diagnosis not present

## 2023-04-13 NOTE — Progress Notes (Unsigned)
 [facility] [address] [place] [address] [place] [address] Dept: [phone] Dept Fax: [phone]  Psychotherapy Progress Note  Patient ID: John Lozano, male  DOB: Dec 24, 1980, 43 y.o.  MRN: [id number]  04/13/2023 Start time: 2:00 PM End time: 3:00 PM  Method of Visit: Face-to-Face  Present: patient  Current Concerns:  How to manage multiple stressors effectively without feeling need to procrastinate How to manage anxiety and root causes of the anxiety of having first child Identify areas in patient's past that anxiety may have stemmed from and educate on how self-medication through substance use had happened  We established a list of aspects Asser would like to look into. Concerns/goals of treatment primarily based in anxiety that preceded any form of substance use. We discussed procrastination and the nature of emotional dysregulation related to it. We discussed how much more pervasive his anxiety may have contributed to his lifestyle and a variety aspect of his life. We also touched on the future and why he is experiencing significant anxiety once his son is born.   Current Symptoms: Anxiety  Psychiatric Specialty Exam: General Appearance: Casual  Eye Contact:  Good  Speech:  Clear and Coherent and Normal Rate  Volume:  Normal  Mood:  Anxious  Affect:  Appropriate and Congruent  Thought Process:  Coherent, Goal Directed, and Linear  Orientation:  Full (Time, Place, and Person)  Thought Content:  Logical  Suicidal Thoughts:  No  Homicidal Thoughts:  No  Memory:  Remote;   Fair  Judgement:  Intact  Insight:  Fair  Psychomotor Activity:  Normal  Concentration:  Concentration: Fair  Recall:  Good  Fund of Knowledge:Good  Language: Good  Akathisia:  No    AIMS (if indicated):  not done  Assets:  Communication Skills Desire for Improvement Financial  Resources/Insurance Housing Intimacy Leisure Time Physical Health Resilience Social Support Talents/Skills Transportation  ADL's:  Intact  Cognition: WNL  Sleep:  Good     Diagnosis: Generalized Anxiety Disorder, Benzodiazepine dependence  Anticipated Frequency of Visits: every other week Anticipated Length of Treatment Episode: 20      03/21/23 2118  Anxiety Long Term Goals  Anxiety Long Term Goals Stabilize anxiety level while increasing ability to function on a daily basis;Resolve the core conflict that is the source of anxiety  Anxiety Short Term Goals  Anxiety Short Term Goals Identify the major life conflicts from the past and present that form the basis for present anxiety;Verbalize an understanding of how thoughts, physical feelings, and behavioral actions contribute to anxiety and it's treatment,  Identify the major life conflicts from the past and present that form the basis for present anxiety Ask the client to develop and process a list of key past and present life conflicts that continue to cause worry  Strategies to reduce or eliminate the irrational anxiety Develop behavioral and cognitive strategies to reduce or eliminate the irrational anxiety;Identify and use specific coping strategies for anxiety reduction  Increase participation in daily social, academic and vocational activities Strengthen his/her new non avoidant approach and build confidence  Participate in Cognitive Behavioral group treatment for anxiety To learn about anxiety  Anxiety Treatment Plan Status  Anxiety Treatment Plan Status Initial     Treatment Intervention: Cognitive Behavioral therapy and Psychoeducation  Medical Necessity: Improved patient condition  Assessment Tools:    01/24/2023    3:34 PM 01/03/2023    4:43 PM 01/03/2023    4:18 PM  Depression  screen PHQ 2/9  Decreased Interest 0 0 0  Down, Depressed, Hopeless 0 0 0  PHQ - 2 Score 0 0 0  Altered sleeping 3 1 1   Tired,  decreased energy 1 1 1   Change in appetite 1 0 0  Feeling bad or failure about yourself  0 0 0  Trouble concentrating 0 0 0  Moving slowly or fidgety/restless 0 0 0  Suicidal thoughts 0 0 0  PHQ-9 Score 5 2 2   Difficult doing work/chores Not difficult at all Not difficult at all Not difficult at all   Failed to redirect to the Timeline version of the REVFS SmartLink. Flowsheet Row ED from 09/11/2021 in [facility] RISK CATEGORY No Risk       Collaboration of Care:   Patient/Guardian was advised Release of Information must be obtained prior to any record release in order to collaborate their care with an outside provider. Patient/Guardian was advised if they have not already done so to contact the registration department to sign all necessary forms in order for [place] to release information regarding their care.   Consent: Patient/Guardian gives verbal consent for treatment and assignment of benefits for services provided during this visit. Patient/Guardian expressed understanding and agreed to proceed.   Plan: Continue psychotherapy. Likely will be a mix of psychodynamic psychotherapy, supportive psychotherapy, and elements of CBT. Will explore which therapeutic option is most effective for patient and proceed from there.  Park Pope, MD 04/13/2023

## 2023-04-24 NOTE — Progress Notes (Unsigned)
 [facility] [address] [place] [address] [place] [address] Dept: [phone] Dept Fax: [phone]  Psychotherapy Progress Note  Patient ID: John Lozano, male  DOB: August 02, 1980, 43 y.o.  MRN: [id number]  04/24/2023 Start time: 2:00 PM End time: 3:00 PM  Method of Visit: Face-to-Face  Present: patient  Current Concerns:  How to manage multiple stressors effectively without feeling need to procrastinate How to manage anxiety and root causes of the anxiety of having first child Identify areas in patient's past that anxiety may have stemmed from and educate on how self-medication through substance use had happened  We discussed patient's ongoing stressors as well as anxiety that centers around spending money.  We discussed how he manages anxiety and how he is handling wife's pregnancy. He is overall in good spirits and has good insight into his stressors. We explored how he best handles being overworked and stress.  Current Symptoms: Anxiety  Psychiatric Specialty Exam: General Appearance: Casual  Eye Contact:  Good  Speech:  Clear and Coherent and Normal Rate  Volume:  Normal  Mood:  Anxious  Affect:  Appropriate and Congruent  Thought Process:  Coherent, Goal Directed, and Linear  Orientation:  Full (Time, Place, and Person)  Thought Content:  Logical  Suicidal Thoughts:  No  Homicidal Thoughts:  No  Memory:  Remote;   Fair  Judgement:  Intact  Insight:  Fair  Psychomotor Activity:  Normal  Concentration:  Concentration: Fair  Recall:  Good  Fund of Knowledge:Good  Language: Good  Akathisia:  No    AIMS (if indicated):  not done  Assets:  Communication Skills Desire for Improvement Financial Resources/Insurance Housing Intimacy Leisure Time Physical Health Resilience Social Support Talents/Skills Transportation  ADL's:  Intact  Cognition: WNL  Sleep:  Good     Diagnosis:  Generalized Anxiety Disorder, Benzodiazepine dependence  Anticipated Frequency of Visits: every other week Anticipated Length of Treatment Episode: 5      03/21/23 2118  Anxiety Long Term Goals  Anxiety Long Term Goals Stabilize anxiety level while increasing ability to function on a daily basis;Resolve the core conflict that is the source of anxiety  Anxiety Short Term Goals  Anxiety Short Term Goals Identify the major life conflicts from the past and present that form the basis for present anxiety;Verbalize an understanding of how thoughts, physical feelings, and behavioral actions contribute to anxiety and it's treatment,  Identify the major life conflicts from the past and present that form the basis for present anxiety Ask the client to develop and process a list of key past and present life conflicts that continue to cause worry  Strategies to reduce or eliminate the irrational anxiety Develop behavioral and cognitive strategies to reduce or eliminate the irrational anxiety;Identify and use specific coping strategies for anxiety reduction  Increase participation in daily social, academic and vocational activities Strengthen his/her new non avoidant approach and build confidence  Participate in Cognitive Behavioral group treatment for anxiety To learn about anxiety  Anxiety Treatment Plan Status  Anxiety Treatment Plan Status Initial     Treatment Intervention: Cognitive Behavioral therapy and Psychoeducation  Medical Necessity: Improved patient condition  Assessment Tools:    01/24/2023    3:34 PM 01/03/2023    4:43 PM 01/03/2023    4:18 PM  Depression screen PHQ 2/9  Decreased Interest 0 0 0  Down, Depressed, Hopeless 0 0 0  PHQ - 2 Score 0 0 0  Altered sleeping  3 1 1   Tired, decreased energy 1 1 1   Change in appetite 1 0 0  Feeling bad or failure about yourself  0 0 0  Trouble concentrating 0 0 0  Moving slowly or fidgety/restless 0 0 0  Suicidal thoughts 0 0 0   PHQ-9 Score 5 2 2   Difficult doing work/chores Not difficult at all Not difficult at all Not difficult at all   Failed to redirect to the Timeline version of the REVFS SmartLink. Flowsheet Row ED from 09/11/2021 in [facility] RISK CATEGORY No Risk       Collaboration of Care:   Patient/Guardian was advised Release of Information must be obtained prior to any record release in order to collaborate their care with an outside provider. Patient/Guardian was advised if they have not already done so to contact the registration department to sign all necessary forms in order for [place] to release information regarding their care.   Consent: Patient/Guardian gives verbal consent for treatment and assignment of benefits for services provided during this visit. Patient/Guardian expressed understanding and agreed to proceed.   Plan: Continue psychotherapy. Likely will be a mix of psychodynamic psychotherapy, supportive psychotherapy, and elements of CBT. Will explore which therapeutic option is most effective for patient and proceed from there.  Park Pope, MD 04/24/2023

## 2023-04-27 ENCOUNTER — Ambulatory Visit (HOSPITAL_COMMUNITY): Payer: 59 | Admitting: Student

## 2023-05-04 ENCOUNTER — Telehealth (HOSPITAL_BASED_OUTPATIENT_CLINIC_OR_DEPARTMENT_OTHER): Payer: PRIVATE HEALTH INSURANCE | Admitting: Student

## 2023-05-04 DIAGNOSIS — F411 Generalized anxiety disorder: Secondary | ICD-10-CM

## 2023-05-04 MED ORDER — TRAZODONE HCL 50 MG PO TABS: 25.0000 mg | ORAL_TABLET | Freq: Every evening | ORAL | 0 refills | Status: DC | PRN

## 2023-05-04 MED ORDER — SERTRALINE HCL 50 MG PO TABS: 75.0000 mg | ORAL_TABLET | Freq: Every day | ORAL | 0 refills | Status: DC

## 2023-05-04 MED ORDER — DIAZEPAM 2 MG PO TABS: 8.0000 mg | ORAL_TABLET | Freq: Every day | ORAL | 0 refills | Status: DC

## 2023-05-04 NOTE — Addendum Note (Signed)
 Addended by: Everlena Cooper on: 05/04/2023 04:00 PM   Modules accepted: Level of Service

## 2023-05-04 NOTE — Progress Notes (Signed)
 Psychiatric Follow Up Adult Assessment  Patient Identification: John Lozano MRN:  [id number] Referral Source: [name], FNP  Televisit via video: I connected with John Lozano on 05/04/23 at  1:30 PM EDT by a video enabled telemedicine application and verified that I am speaking with the correct person using two identifiers.  Location: Patient: home Provider: office   I discussed the limitations of evaluation and management by telemedicine and the availability of in person appointments. The patient expressed understanding and agreed to proceed.  I discussed the assessment and treatment plan with the patient. The patient was provided an opportunity to ask questions and all were answered. The patient agreed with the plan and demonstrated an understanding of the instructions.   The patient was advised to call back or seek an in-person evaluation if the symptoms worsen or if the condition fails to improve as anticipated.   Assessment:  John Lozano is a 43 y.o. male with a history of opiate use disorder, sedative use disorder, asthma, erectile dysfunction, chronic hepatitis C who presents in person to [facility] for medication management. Based on initial assessment, his symptoms are consistent with OUD, GAD, and Benzodiazepine dependence. He was started on started on sertraline to address GAD and was switched to Valium via [place] protocol to be slowly tapered off benzodiazepine.   Patient reports some symptoms of anxiety secondary to multiple ongoing stressors but overall feels better than before. We are increasing his sertraline to 75 mg to better address the symptoms.  He has successfully cross tapered to valium 10 mg with minimal somatic complaints. Decreasing valium by 2 mg for the next month. [place] protocol decreases by 1 mg every 1-2 weeks but given monthly re-assessments, will decrease by 2 mg monthly unless benzodiazepine withdrawal  symptoms become intolerable. Increasing sertraline to 75 mg to better address patient's anxiety. Hydroxyzine has been partially effective as PRN anxiolytic. No major alcohol cravings reported and PCP continues to manage his suboxone for OUD. Patient to continue psychotherapy with me every 2 weeks so can monitor valium taper semi-regularly.   Plan to evaluate withdrawal via CIWA-B and obtain vitals next in person visit.   Plan:  # Generalized anxiety disorder Past medication trials: clonazepam (dependence) Interventions: -- increase sertraline to 75 mg -- continue hydroxyzine 25 mg 3 times daily as needed for anxiety  # Benzodiazepine dependence Past medication trials: clonazepam (rebound anxiety at lower doses) Interventions: -- decrease valium from 10 to 8 mg daily for 1 month  -CIWA-B and vitals next med mgmt visit --Continue trazodone 25 mg nightly as needed for insomnia  # Opioid Use Disorder, in sustained remission -- defer management of suboxone per PCP  # Alcohol Use Disorder, in early remission -- last use: 12/2022 -- consider naltrexone if alcohol craving worsens  Return to care in 4 weeks  Patient was given contact information for behavioral health clinic and was instructed to call 911 for emergencies.    Patient and plan of care will be discussed with the Attending MD, Dr. Mercy Riding, who agrees with the above statement and plan.   Subjective:  Chief Complaint: Medication Management  Interval History: Patient presents virtually for follow up appointment.  Patient reports that he has been relatively stable since last visit.  He had noted pulmonary recently due to multiple ongoing stressors including finances and patient's wife almost due. He continues to be amenable to Valium taper.  We went over [place] protocol and will decrease Valium by  2 mg for next month and monitor for benzodiazepine withdrawal.  He was amenable to increase of sertraline as well to better control  anxiety.  He denies any feelings of depression or anhedonia. He denies present SI/HI/AVH.  Reports eating and sleeping well. He would like to continue psychotherapy in order to better manage his anxiety and recent changes to his life.  Past Psychiatric History:  Diagnoses: Benzodiazepine dependence, generalized anxiety disorder Medication trials: Clonazepam, trazodone,  Previous psychiatrist/therapist: None Hospitalizations: Denies Suicide attempts: Denies SIB: Denies Hx of violence towards others: Denies Hx of trauma/abuse: denies  Substance Abuse History in the last 12 months:  Yes.    Past Medical History:  Past Medical History:  Diagnosis Date   Asthma    GERD (gastroesophageal reflux disease)    Kidney stones    Open ankle fracture 11/13/2014    Past Surgical History:  Procedure Laterality Date   I & D EXTREMITY Right 11/13/2014   Procedure:  IRRIGATION AND DEBRIDEMENT RIGHT ANKLE, complex wound closure, placement of Vac, and splinting of right ankle ;  Surgeon: Venita Lick, MD;  Location: [facility] OR;  Service: Orthopedics;  Laterality: Right;   ORIF ANKLE FRACTURE Right 11/23/2014   Procedure: OPEN REDUCTION INTERNAL FIXATION (ORIF) RIGHT  BIMALLEOLAR ANKLE FRACTURE;  Surgeon: Toni Arthurs, MD;  Location: [facility] OR;  Service: Orthopedics;  Laterality: Right;   WISDOM TOOTH EXTRACTION      Family Psychiatric History: father significant substance use  Family History:  Family History  Problem Relation Age of Onset   Healthy Mother    Healthy Father     Social History:   Academic/Vocational: Metallurgist Social History   Socioeconomic History   Marital status: Single    Spouse name: Not on file   Number of children: Not on file   Years of education: Not on file   Highest education level: Not on file  Occupational History   Not on file  Tobacco Use   Smoking status: Every Day    Types: E-cigarettes   Smokeless tobacco: Never  Substance and Sexual Activity    Alcohol use: Not Currently    Alcohol/week: 4.0 standard drinks of alcohol    Types: 4 Cans of beer per week    Comment: occasional drink   Drug use: Not Currently    Types: Benzodiazepines    Comment: clonazepam   Sexual activity: Yes    Partners: Female    Birth control/protection: Condom    Comment: 1 monogamous  Other Topics Concern   Not on file  Social History Narrative   Not on file   Social Drivers of Health   Financial Resource Strain: Not on file  Food Insecurity: Not on file  Transportation Needs: Not on file  Physical Activity: Not on file  Stress: Not on file  Social Connections: Not on file    Additional Social History: updated  Allergies:   Allergies  Allergen Reactions   Shellfish Allergy Swelling    Current Medications: Current Outpatient Medications  Medication Sig Dispense Refill   Buprenorphine HCl-Naloxone HCl 8-2 MG FILM Place 6 mg of opioid under the tongue daily.     cetirizine (ZYRTEC) 10 MG tablet Take 10 mg by mouth daily.     hydrOXYzine (ATARAX) 25 MG tablet Take 1 tablet (25 mg total) by mouth 3 (three) times daily as needed. 270 tablet 0   sertraline (ZOLOFT) 50 MG tablet Take 1 tablet (50 mg total) by mouth daily. 90 tablet 0  traZODone (DESYREL) 50 MG tablet Take 0.5-1 tablets (25-50 mg total) by mouth at bedtime as needed for sleep. 90 tablet 0   No current facility-administered medications for this visit.    ROS: Review of Systems   Objective:  Psychiatric Specialty Exam:  There were no vitals taken for this visit.There is no height or weight on file to calculate BMI.  General Appearance: Casual  Eye Contact:  Good  Speech:  Clear and Coherent and Normal Rate  Volume:  Normal  Mood:  Anxious  Affect:  Depressed  Thought Content: Logical   Suicidal Thoughts:  No  Homicidal Thoughts:  No  Thought Process:  Coherent, Goal Directed, and Linear  Orientation:  Full (Time, Place, and Person)  Judgment:  Intact  Insight:   Fair  Concentration:  Concentration: Fair  Fund of Knowledge: Fair  Language: Fair  Psychomotor Activity:  Normal  Akathisia:  No  AIMS (if indicated): not done  Assets:  Communication Skills Desire for Improvement Financial Resources/Insurance Housing Intimacy Leisure Time Physical Health Resilience Social Support Talents/Skills Transportation Vocational/Educational  ADL's:  Intact  Cognition: WNL      PE: General: well-appearing; no acute distress  Pulm: no increased work of breathing on room air  Strength & Muscle Tone: within normal limits Neuro: no focal neurological deficits observed  Gait & Station: normal   Screenings:  GAD-7    Loss adjuster, chartered Office Visit from 01/24/2023 in [facility] Office Visit from 01/03/2023 in [facility] Office Visit from 12/16/2022 in [facility] Office Visit from 08/19/2022 in [facility] [place] Office Visit from 05/17/2022 in [facility]  Total GAD-7 Score 4 4 6  0 3      PHQ2-9    Flowsheet Row Office Visit from 01/24/2023 in [facility] Office Visit from 01/03/2023 in [facility] Office Visit from 12/16/2022 in [facility] Office Visit from 08/19/2022 in [facility] Office Visit from 05/17/2022 in [facility]  PHQ-2 Total Score 0 0 2 0 0  PHQ-9 Total Score 5 2 5 5 5       Flowsheet Row ED from 09/11/2021 in [facility] RISK CATEGORY No Risk        Park Pope, MD 3/26/202512:15 PM

## 2023-05-16 ENCOUNTER — Ambulatory Visit (HOSPITAL_BASED_OUTPATIENT_CLINIC_OR_DEPARTMENT_OTHER): Payer: PRIVATE HEALTH INSURANCE | Admitting: Student

## 2023-05-16 DIAGNOSIS — F411 Generalized anxiety disorder: Secondary | ICD-10-CM

## 2023-05-16 NOTE — Progress Notes (Unsigned)
 [facility] [address] [place] [address] Dept: [phone] Dept Fax: [phone]  Psychotherapy Progress Note  Patient ID: John Lozano, male  DOB: 05-06-1980, 43 y.o.  MRN: [id number]  05/16/2023 Start time: 2:00 PM End time: 3:00 PM  Method of Visit: Face-to-Face  Present: patient  Current Concerns:  How to manage multiple stressors effectively without feeling need to procrastinate How to manage anxiety and root causes of the anxiety of having first child Identify areas in patient's past that anxiety may have stemmed from and educate on how self-medication through substance use had happened  We discussed patient's ongoing stressors as well as anxiety that centers around spending money.  We discussed how he manages anxiety and how he is handling wife's pregnancy. He is overall in good spirits and has good insight into his stressors. We explored how he best handles being overworked and stress.  Current Symptoms: Anxiety  Psychiatric Specialty Exam: General Appearance: Casual  Eye Contact:  Good  Speech:  Clear and Coherent and Normal Rate  Volume:  Normal  Mood:  Anxious  Affect:  Appropriate and Congruent  Thought Process:  Coherent, Goal Directed, and Linear  Orientation:  Full (Time, Place, and Person)  Thought Content:  Logical  Suicidal Thoughts:  No  Homicidal Thoughts:  No  Memory:  Remote;   Fair  Judgement:  Intact  Insight:  Fair  Psychomotor Activity:  Normal  Concentration:  Concentration: Fair  Recall:  Good  Fund of Knowledge:Good  Language: Good  Akathisia:  No    AIMS (if indicated):  not done  Assets:  Communication Skills Desire for Improvement Financial Resources/Insurance Housing Intimacy Leisure Time Physical Health Resilience Social Support Talents/Skills Transportation  ADL's:  Intact  Cognition: WNL  Sleep:  Good     Diagnosis:  Generalized Anxiety Disorder, Benzodiazepine dependence  Anticipated Frequency of Visits: every other week Anticipated Length of Treatment Episode: 5      03/21/23 2118  Anxiety Long Term Goals  Anxiety Long Term Goals Stabilize anxiety level while increasing ability to function on a daily basis;Resolve the core conflict that is the source of anxiety  Anxiety Short Term Goals  Anxiety Short Term Goals Identify the major life conflicts from the past and present that form the basis for present anxiety;Verbalize an understanding of how thoughts, physical feelings, and behavioral actions contribute to anxiety and it's treatment,  Identify the major life conflicts from the past and present that form the basis for present anxiety Ask the client to develop and process a list of key past and present life conflicts that continue to cause worry  Strategies to reduce or eliminate the irrational anxiety Develop behavioral and cognitive strategies to reduce or eliminate the irrational anxiety;Identify and use specific coping strategies for anxiety reduction  Increase participation in daily social, academic and vocational activities Strengthen his/her new non avoidant approach and build confidence  Participate in Cognitive Behavioral group treatment for anxiety To learn about anxiety  Anxiety Treatment Plan Status  Anxiety Treatment Plan Status Initial     Treatment Intervention: Cognitive Behavioral therapy and Psychoeducation  Medical Necessity: Improved patient condition  Assessment Tools:    01/24/2023    3:34 PM 01/03/2023    4:43 PM 01/03/2023    4:18 PM  Depression screen PHQ 2/9  Decreased Interest 0 0 0  Down, Depressed, Hopeless 0 0 0  PHQ - 2 Score 0 0 0  Altered sleeping  3 1 1   Tired, decreased energy 1 1 1   Change in appetite 1 0 0  Feeling bad or failure about yourself  0 0 0  Trouble concentrating 0 0 0  Moving slowly or fidgety/restless 0 0 0  Suicidal thoughts 0 0 0   PHQ-9 Score 5 2 2   Difficult doing work/chores Not difficult at all Not difficult at all Not difficult at all   Failed to redirect to the Timeline version of the REVFS SmartLink. Flowsheet Row ED from 09/11/2021 in [facility] RISK CATEGORY No Risk       Collaboration of Care:   Patient/Guardian was advised Release of Information must be obtained prior to any record release in order to collaborate their care with an outside provider. Patient/Guardian was advised if they have not already done so to contact the registration department to sign all necessary forms in order for [place] to release information regarding their care.   Consent: Patient/Guardian gives verbal consent for treatment and assignment of benefits for services provided during this visit. Patient/Guardian expressed understanding and agreed to proceed.   Plan: Continue psychotherapy. Likely will be a mix of psychodynamic psychotherapy, supportive psychotherapy, and elements of CBT. Will explore which therapeutic option is most effective for patient and proceed from there.  Park Pope, MD 05/16/2023

## 2023-05-17 ENCOUNTER — Encounter (HOSPITAL_COMMUNITY): Payer: Self-pay | Admitting: Student

## 2023-05-29 NOTE — Progress Notes (Deleted)
   03/21/23 2118  Anxiety Long Term Goals  Anxiety Long Term Goals Stabilize anxiety level while increasing ability to function on a daily basis;Resolve the core conflict that is the source of anxiety  Anxiety Short Term Goals  Anxiety Short Term Goals Identify the major life conflicts from the past and present that form the basis for present anxiety;Verbalize an understanding of how thoughts, physical feelings, and behavioral actions contribute to anxiety and it's treatment,  Identify the major life conflicts from the past and present that form the basis for present anxiety Ask the client to develop and process a list of key past and present life conflicts that continue to cause worry  Strategies to reduce or eliminate the irrational anxiety Develop behavioral and cognitive strategies to reduce or eliminate the irrational anxiety;Identify and use specific coping strategies for anxiety reduction  Increase participation in daily social, academic and vocational activities Strengthen his/her new non avoidant approach and build confidence  Participate in Cognitive Behavioral group treatment for anxiety To learn about anxiety  Anxiety Treatment Plan Status  Anxiety Treatment Plan Status Initial

## 2023-05-30 ENCOUNTER — Ambulatory Visit (HOSPITAL_COMMUNITY): Admitting: Student

## 2023-05-30 ENCOUNTER — Telehealth (HOSPITAL_COMMUNITY): Payer: Self-pay | Admitting: Student

## 2023-05-30 DIAGNOSIS — F411 Generalized anxiety disorder: Secondary | ICD-10-CM

## 2023-05-30 MED ORDER — DIAZEPAM 2 MG PO TABS: 8.0000 mg | ORAL_TABLET | Freq: Every day | ORAL | 0 refills | Status: DC

## 2023-05-30 NOTE — Telephone Encounter (Signed)
 Called patient regarding therapy appointment. He answered and reports completely forgetting citing prepping for anniversary with wife. We discussed need to call and reschedule should he be unable to make it to appointments. Front desk reached out to him to get scheduled in 3 weeks.  Refilled patient's valium  to have him covered until next appointment on 06/20/23

## 2023-06-19 NOTE — Progress Notes (Unsigned)
 Psychiatric Follow Up Adult Assessment  Patient Identification: John Lozano MRN:  [id number] Referral Source: Jenelle Mis, FNP  Televisit via video: I connected with John Lozano on 06/20/23 at  3:30 PM EDT by a video enabled telemedicine application and verified that I am speaking with the correct person using two identifiers.  Location: Patient: home Provider: office   I discussed the limitations of evaluation and management by telemedicine and the availability of in person appointments. The patient expressed understanding and agreed to proceed.  I discussed the assessment and treatment plan with the patient. The patient was provided an opportunity to ask questions and all were answered. The patient agreed with the plan and demonstrated an understanding of the instructions.   The patient was advised to call back or seek an in-person evaluation if the symptoms worsen or if the condition fails to improve as anticipated.   Assessment:  John Lozano is a 43 y.o. male with a history of opiate use disorder, sedative use disorder, asthma, erectile dysfunction, chronic hepatitis C who presents in person to [facility] for medication management. Based on initial assessment, his symptoms are consistent with OUD, GAD, and Benzodiazepine dependence. He was started on started on sertraline  to address GAD and was switched to Valium  via [facility] protocol to be slowly tapered off benzodiazepine.   Patient reports manageable symptoms of anxiety secondary to multiple ongoing stressors but overall feels better than before. Symptoms have improved since increase in sertraline . He has not had major withdrawal from decrease in valium  to 8 mg so plan to continue taper down to 6 mg for the next month. [place] protocol decreases by 1 mg every 1-2 weeks but given monthly re-assessments, will decrease by 2 mg monthly unless benzodiazepine withdrawal symptoms become  intolerable. Hydroxyzine  has been overall effective as PRN anxiolytic. No major alcohol cravings reported and PCP continues to manage his suboxone  for OUD. Patient will be transitioned to Dr. Camillo Celestine starting July and patient is amenable to this plan.  Plan:  # Generalized anxiety disorder Past medication trials: clonazepam  (dependence) Interventions: -- continue sertraline  75 mg -- continue hydroxyzine  25 mg 3 times daily as needed for anxiety  # Benzodiazepine dependence Past medication trials: clonazepam  (rebound anxiety at lower doses) Interventions: -- decrease valium  from 8 to 6 mg daily for 1 month  -PDMP reviewed, shows appropriate filling --Continue trazodone  25 mg nightly as needed for insomnia  # Opioid Use Disorder, in sustained remission -- defer management of suboxone  per PCP  # Alcohol Use Disorder, in early remission -- last use: 12/2022 -- consider naltrexone if alcohol craving worsens  Return to care in 4 weeks  Patient was given contact information for behavioral health clinic and was instructed to call 911 for emergencies.    Patient and plan of care will be discussed with the Attending MD, Dr. Sharalyn Dasen, who agrees with the above statement and plan.   Subjective:  Chief Complaint: Medication Management  Interval History: Patient presents virtually for follow up appointment.  He denies SI/HI/AVH.  He reports having a couple of days of headache and a few days of sleep disturbance after decrease in Valium  but overall tolerated the decrease well.  He was amenable to continuing decreasing Valium  dose.  He denies any appetite changes, chest palpitations, tremors, vision changes, worsening anxiety, or worsening fatigue.  He reports overall eating and sleeping well.  He reports increase in sertraline  has helped with his overall anxiety although still has some  related to ongoing stressors including setting up the house for newborn as well as working a lot to ensure  financial stability.  Past Psychiatric History:  Diagnoses: Benzodiazepine dependence, generalized anxiety disorder Medication trials: Clonazepam , trazodone ,  Previous psychiatrist/therapist: None Hospitalizations: Denies Suicide attempts: Denies SIB: Denies Hx of violence towards others: Denies Hx of trauma/abuse: denies  Substance Abuse History in the last 12 months:  Yes.    Past Medical History:  Past Medical History:  Diagnosis Date   Asthma    GERD (gastroesophageal reflux disease)    Kidney stones    Open ankle fracture 11/13/2014    Past Surgical History:  Procedure Laterality Date   I & D EXTREMITY Right 11/13/2014   Procedure:  IRRIGATION AND DEBRIDEMENT RIGHT ANKLE, complex wound closure, placement of Vac, and splinting of right ankle ;  Surgeon: Mort Ards, MD;  Location: [facility] OR;  Service: Orthopedics;  Laterality: Right;   ORIF ANKLE FRACTURE Right 11/23/2014   Procedure: OPEN REDUCTION INTERNAL FIXATION (ORIF) RIGHT  BIMALLEOLAR ANKLE FRACTURE;  Surgeon: Amada Backer, MD;  Location: [facility] OR;  Service: Orthopedics;  Laterality: Right;   WISDOM TOOTH EXTRACTION      Family Psychiatric History: father significant substance use  Family History:  Family History  Problem Relation Age of Onset   Healthy Mother    Healthy Father     Social History:   Academic/Vocational: Metallurgist Social History   Socioeconomic History   Marital status: Single    Spouse name: Not on file   Number of children: Not on file   Years of education: Not on file   Highest education level: Not on file  Occupational History   Not on file  Tobacco Use   Smoking status: Every Day    Types: E-cigarettes   Smokeless tobacco: Never  Substance and Sexual Activity   Alcohol use: Not Currently    Alcohol/week: 4.0 standard drinks of alcohol    Types: 4 Cans of beer per week    Comment: occasional drink   Drug use: Not Currently    Types: Benzodiazepines    Comment:  clonazepam    Sexual activity: Yes    Partners: Female    Birth control/protection: Condom    Comment: 1 monogamous  Other Topics Concern   Not on file  Social History Narrative   Not on file   Social Drivers of Health   Financial Resource Strain: Not on file  Food Insecurity: Not on file  Transportation Needs: Not on file  Physical Activity: Not on file  Stress: Not on file  Social Connections: Not on file    Additional Social History: updated  Allergies:   Allergies  Allergen Reactions   Shellfish Allergy Swelling    Current Medications: Current Outpatient Medications  Medication Sig Dispense Refill   Buprenorphine  HCl-Naloxone HCl 8-2 MG FILM Place 6 mg of opioid under the tongue daily.     cetirizine (ZYRTEC) 10 MG tablet Take 10 mg by mouth daily.     [START ON 06/21/2023] diazepam  (VALIUM ) 2 MG tablet Take 3 tablets (6 mg total) by mouth daily. 90 tablet 0   hydrOXYzine  (ATARAX ) 25 MG tablet Take 1 tablet (25 mg total) by mouth 3 (three) times daily as needed. 270 tablet 0   sertraline  (ZOLOFT ) 50 MG tablet Take 1.5 tablets (75 mg total) by mouth daily. 135 tablet 0   traZODone  (DESYREL ) 50 MG tablet Take 0.5-1 tablets (25-50 mg total) by mouth at bedtime as needed for  sleep. 90 tablet 0   No current facility-administered medications for this visit.    ROS: Review of Systems   Objective:  Psychiatric Specialty Exam:  There were no vitals taken for this visit.There is no height or weight on file to calculate BMI.  General Appearance: Casual  Eye Contact:  Good  Speech:  Clear and Coherent and Normal Rate  Volume:  Normal  Mood:  Anxious  Affect:  Appropriate and Congruent  Thought Content: Logical   Suicidal Thoughts:  No  Homicidal Thoughts:  No  Thought Process:  Coherent, Goal Directed, and Linear  Orientation:  Full (Time, Place, and Person)  Judgment:  Intact  Insight:  Fair  Concentration:  Concentration: Fair  Fund of Knowledge: Fair   Language: Fair  Psychomotor Activity:  Normal  Akathisia:  No  AIMS (if indicated): not done  Assets:  Communication Skills Desire for Improvement Financial Resources/Insurance Housing Intimacy Leisure Time Physical Health Resilience Social Support Talents/Skills Transportation Vocational/Educational  ADL's:  Intact  Cognition: WNL      PE: General: well-appearing; no acute distress  Pulm: no increased work of breathing on room air  Strength & Muscle Tone: within normal limits Neuro: no focal neurological deficits observed  Gait & Station: normal   Screenings:  GAD-7    Loss adjuster, chartered Office Visit from 01/24/2023 in [facility] Office Visit from 01/03/2023 in [facility] [place] [facility] Office Visit from 12/16/2022 in [facility] Office Visit from 08/19/2022 in [facility] Office Visit from 05/17/2022 in [facility]  Total GAD-7 Score 4 4 6  0 3      PHQ2-9    Flowsheet Row Office Visit from 01/24/2023 in [facility] [place] [facility] Office Visit from 01/03/2023 in [facility] Office Visit from 12/16/2022 in [facility] Office Visit from 08/19/2022 in [facility] Office Visit from 05/17/2022 [facility]  PHQ-2 Total Score 0 0 2 0 0  PHQ-9 Total Score 5 2 5 5 5       Flowsheet Row UC from 09/11/2021 in [facility] RISK CATEGORY No Risk        Augusta Blizzard, MD 5/12/20254:40 PM

## 2023-06-20 ENCOUNTER — Telehealth (HOSPITAL_BASED_OUTPATIENT_CLINIC_OR_DEPARTMENT_OTHER): Payer: PRIVATE HEALTH INSURANCE | Admitting: Student

## 2023-06-20 ENCOUNTER — Encounter (HOSPITAL_COMMUNITY): Payer: Self-pay | Admitting: Student

## 2023-06-20 DIAGNOSIS — F1320 Sedative, hypnotic or anxiolytic dependence, uncomplicated: Secondary | ICD-10-CM

## 2023-06-20 DIAGNOSIS — F411 Generalized anxiety disorder: Secondary | ICD-10-CM

## 2023-06-20 MED ORDER — DIAZEPAM 2 MG PO TABS: 6.0000 mg | ORAL_TABLET | Freq: Every day | ORAL | 0 refills | Status: AC

## 2023-06-21 NOTE — Addendum Note (Signed)
 Addended by: Donnelly Gainer on: 06/21/2023 08:53 AM   Modules accepted: Level of Service

## 2023-07-11 ENCOUNTER — Telehealth (HOSPITAL_COMMUNITY): Admitting: Student

## 2023-07-12 ENCOUNTER — Telehealth: Payer: Self-pay

## 2023-07-13 NOTE — Telephone Encounter (Signed)
 See previous encounter for order faxed.

## 2023-07-29 NOTE — Progress Notes (Signed)
 Psychiatric Follow Up Adult Assessment  Patient Identification: John Lozano MRN:  [id number] Referral Source: Kayla Jeoffrey RAMAN, FNP  Televisit via video: I connected with [name] on 07/29/23 at  3:30 PM EDT by a video enabled telemedicine application and verified that I am speaking with the correct person using two identifiers.  Location: Patient: home Provider: office   I discussed the limitations of evaluation and management by telemedicine and the availability of in person appointments. The patient expressed understanding and agreed to proceed.  I discussed the assessment and treatment plan with the patient. The patient was provided an opportunity to ask questions and all were answered. The patient agreed with the plan and demonstrated an understanding of the instructions.   The patient was advised to call back or seek an in-person evaluation if the symptoms worsen or if the condition fails to improve as anticipated.   Assessment:  John Lozano is a 43 y.o. male with a history of opiate use disorder, sedative use disorder, asthma, erectile dysfunction, chronic hepatitis C who presents in person to [facility] for medication management. Based on initial assessment, his symptoms are consistent with OUD, GAD, and Benzodiazepine dependence. He was started on started on sertraline  to address GAD and was switched to Valium  via [place] protocol to be slowly tapered off benzodiazepine.   Patient reports recent struggling with anxiety secondary to multiple stressors but overall still feels that it is manageable.  He had decreased his Valium  to 7 mg rather than 6.  He was amenable to continuing tapering with the goal to get to 5 mg in the next month.  Patient will be following up with Dr. Izella in 1 month to continue benzodiazepine taper and management of anxiety. He has not had major withdrawal from current taper schedule. Hydroxyzine  has been overall  effective as PRN anxiolytic. No major alcohol cravings reported and PCP continues to manage his suboxone  for OUD. Patient should get repeat vitals next in person visit.  Plan:  # Generalized anxiety disorder Past medication trials: clonazepam  (dependence) Interventions: -- continue sertraline  75 mg -- continue hydroxyzine  25 mg 3 times daily as needed for anxiety  # Benzodiazepine dependence Past medication trials: clonazepam  (rebound anxiety at lower doses) Interventions: -- decrease valium  from 7 mg to 6 mg for 2 weeks THEN decrease to 5 mg daily  -PDMP reviewed, shows appropriate filling --Continue trazodone  25 mg nightly as needed for insomnia  # Opioid Use Disorder, in sustained remission -- defer management of suboxone  per PCP  # Alcohol Use Disorder, in early remission -- last use: 12/2022 -- consider naltrexone if alcohol craving worsens  Return to care in 4 weeks  Patient was given contact information for behavioral health clinic and was instructed to call 911 for emergencies.    Patient and plan of care will be discussed with the Attending MD, Dr. Carvin, who agrees with the above statement and plan.   Subjective:  Chief Complaint: Medication Management  Interval History: Patient presents virtually for follow up appointment.  He denies SI/HI/AVH.  He reports recently struggling with more anxiety and fatigue but reports this is likely secondary to working 9 days straight as well as him and girlfriend's child to be born via cesarean next Tuesday.  He reports using trazodone  more frequently to help with sleep initiation.  He reports not being able to completely decrease down to 6 mg of Valium  but only made it to 7. He was amenable to continuing  decreasing Valium  dose.  He denies any appetite changes, chest palpitations, tremors, or vision changes.  He reports overall eating and sleeping well.   Past Psychiatric History:  Diagnoses: Benzodiazepine dependence, generalized  anxiety disorder Medication trials: Clonazepam , trazodone ,  Previous psychiatrist/therapist: None Hospitalizations: Denies Suicide attempts: Denies SIB: Denies Hx of violence towards others: Denies Hx of trauma/abuse: denies  Substance Abuse History in the last 12 months:  Yes.    Past Medical History:  Past Medical History:  Diagnosis Date   Asthma    GERD (gastroesophageal reflux disease)    Kidney stones    Open ankle fracture 11/13/2014    Past Surgical History:  Procedure Laterality Date   I & D EXTREMITY Right 11/13/2014   Procedure:  IRRIGATION AND DEBRIDEMENT RIGHT ANKLE, complex wound closure, placement of Vac, and splinting of right ankle ;  Surgeon: Donaciano Sprang, MD;  Location: [facility] OR;  Service: Orthopedics;  Laterality: Right;   ORIF ANKLE FRACTURE Right 11/23/2014   Procedure: OPEN REDUCTION INTERNAL FIXATION (ORIF) RIGHT  BIMALLEOLAR ANKLE FRACTURE;  Surgeon: Norleen Armor, MD;  Location: [facility];  Service: Orthopedics;  Laterality: Right;   WISDOM TOOTH EXTRACTION      Family Psychiatric History: father significant substance use  Family History:  Family History  Problem Relation Age of Onset   Healthy Mother    Healthy Father     Social History:   Academic/Vocational: Metallurgist Social History   Socioeconomic History   Marital status: Single    Spouse name: Not on file   Number of children: Not on file   Years of education: Not on file   Highest education level: Not on file  Occupational History   Not on file  Tobacco Use   Smoking status: Every Day    Types: E-cigarettes   Smokeless tobacco: Never  Substance and Sexual Activity   Alcohol use: Not Currently    Alcohol/week: 4.0 standard drinks of alcohol    Types: 4 Cans of beer per week    Comment: occasional drink   Drug use: Not Currently    Types: Benzodiazepines    Comment: clonazepam    Sexual activity: Yes    Partners: Female    Birth control/protection: Condom    Comment: 1  monogamous  Other Topics Concern   Not on file  Social History Narrative   Not on file   Social Drivers of Health   Financial Resource Strain: Not on file  Food Insecurity: Not on file  Transportation Needs: Not on file  Physical Activity: Not on file  Stress: Not on file  Social Connections: Not on file    Additional Social History: updated  Allergies:   Allergies  Allergen Reactions   Shellfish Allergy Swelling    Current Medications: Current Outpatient Medications  Medication Sig Dispense Refill   Buprenorphine  HCl-Naloxone HCl 8-2 MG FILM Place 6 mg of opioid under the tongue daily.     cetirizine (ZYRTEC) 10 MG tablet Take 10 mg by mouth daily.     hydrOXYzine  (ATARAX ) 25 MG tablet Take 1 tablet (25 mg total) by mouth 3 (three) times daily as needed. 270 tablet 0   sertraline  (ZOLOFT ) 50 MG tablet Take 1.5 tablets (75 mg total) by mouth daily. 135 tablet 0   traZODone  (DESYREL ) 50 MG tablet Take 0.5-1 tablets (25-50 mg total) by mouth at bedtime as needed for sleep. 90 tablet 0   No current facility-administered medications for this visit.    ROS: Review of  Systems   Objective:  Psychiatric Specialty Exam:  There were no vitals taken for this visit.There is no height or weight on file to calculate BMI.  General Appearance: Casual  Eye Contact:  Good  Speech:  Clear and Coherent and Normal Rate  Volume:  Normal  Mood:  Anxious  Affect:  Appropriate and Congruent  Thought Content: Logical   Suicidal Thoughts:  No  Homicidal Thoughts:  No  Thought Process:  Coherent, Goal Directed, and Linear  Orientation:  Full (Time, Place, and Person)  Judgment:  Intact  Insight:  Fair  Concentration:  Concentration: Fair  Fund of Knowledge: Fair  Language: Fair  Psychomotor Activity:  Normal  Akathisia:  No  AIMS (if indicated): not done  Assets:  Communication Skills Desire for Improvement Financial Resources/Insurance Housing Intimacy Leisure Time Physical  Health Resilience Social Support Talents/Skills Transportation Vocational/Educational  ADL's:  Intact  Cognition: WNL      PE: General: well-appearing; no acute distress  Pulm: no increased work of breathing on room air  Strength & Muscle Tone: within normal limits Neuro: no focal neurological deficits observed  Gait & Station: normal   Screenings:  GAD-7    Loss adjuster, chartered Office Visit from 01/24/2023 in [facility] Office Visit from 01/03/2023 in [facility] [place] [facility] Office Visit from 12/16/2022 in [facility] [place] [facility] Office Visit from 08/19/2022 in [facility] Office Visit from 05/17/2022 in [facility] [facility]  Total GAD-7 Score 4 4 6  0 3   PHQ2-9    Flowsheet Row Office Visit from 01/24/2023 in [facility] [place] [facility] Office Visit from 01/03/2023 in [facility] Office Visit from 12/16/2022 in [facility] Office Visit from 08/19/2022 in [facility] Office Visit from 05/17/2022 in [facility]  PHQ-2 Total Score 0 0 2 0 0  PHQ-9 Total Score 5 2 5 5 5    Flowsheet Row UC from 09/11/2021 in [facility] RISK CATEGORY No Risk     Prentice Espy, MD 6/20/20251:29 PM

## 2023-08-01 ENCOUNTER — Telehealth (HOSPITAL_BASED_OUTPATIENT_CLINIC_OR_DEPARTMENT_OTHER): Payer: PRIVATE HEALTH INSURANCE | Admitting: Student

## 2023-08-01 DIAGNOSIS — F1191 Opioid use, unspecified, in remission: Secondary | ICD-10-CM | POA: Diagnosis not present

## 2023-08-01 DIAGNOSIS — F1320 Sedative, hypnotic or anxiolytic dependence, uncomplicated: Secondary | ICD-10-CM

## 2023-08-01 DIAGNOSIS — F411 Generalized anxiety disorder: Secondary | ICD-10-CM | POA: Diagnosis not present

## 2023-08-01 DIAGNOSIS — F1091 Alcohol use, unspecified, in remission: Secondary | ICD-10-CM

## 2023-08-02 MED ORDER — SERTRALINE HCL 50 MG PO TABS
75.0000 mg | ORAL_TABLET | Freq: Every day | ORAL | 0 refills | Status: DC
Start: 2023-08-02 — End: 2023-08-31

## 2023-08-02 MED ORDER — DIAZEPAM 2 MG PO TABS: ORAL_TABLET | ORAL | 0 refills | Status: AC

## 2023-08-05 NOTE — Addendum Note (Signed)
 Addended by: CARVIN CROCK on: 08/05/2023 09:29 AM   Modules accepted: Level of Service

## 2023-08-23 NOTE — Progress Notes (Deleted)
 Psychiatric Follow Up Adult Assessment  Patient Identification: John Lozano MRN:  [id number] Referral Source: Kayla Jeoffrey RAMAN, FNP    Assessment:  John Lozano is a 43 y.o. male with a history of opiate use disorder, sedative use disorder, asthma, erectile dysfunction, chronic hepatitis C who presents in person to [facility] for medication management. Based on initial assessment, his symptoms are consistent with OUD, GAD, and Benzodiazepine dependence. He was started on started on sertraline  to address GAD and was switched to Valium  via [place]al[place] protocol to be slowly tapered off benzodiazepine.   ***  Plan:  # Generalized anxiety disorder Past medication trials: clonazepam  (dependence) Interventions: -- continue sertraline  75 mg -- continue hydroxyzine  25 mg 3 times daily as needed for anxiety  # Benzodiazepine dependence Past medication trials: clonazepam  (rebound anxiety at lower doses) Interventions: -- decrease valium  from 7 mg to 6 mg for 2 weeks THEN decrease to 5 mg daily  -PDMP reviewed 7/15, shows appropriate filling --Continue trazodone  25 mg nightly as needed for insomnia  # Opioid Use Disorder, in sustained remission -- defer management of suboxone  per PCP  # Alcohol Use Disorder, in early remission -- last use: 12/2022 -- consider naltrexone if alcohol craving worsens   Patient was given contact information for behavioral health clinic and was instructed to call 911 for emergencies.    Patient and plan of care will be discussed with the Attending MD, who agrees with the above statement and plan.   Subjective:  Chief Complaint: Medication Management  Interval History: Patient seen ***.  Patient reports feeling *** today. Since the previous visit, ***. Regarding medications, patient notes ***. Patient reports the following adverse effects: ***.   Patient reports *** sleep, ***. Patient reports *** appetite, ***.    Patient rates anxiety a ***/10, depression a ***/10, and anger a ***/10.   Patient denies current SI, HI, and AVH. ***  Stressors include ***.   Substance use: ***   Past Psychiatric History:  Diagnoses: Benzodiazepine dependence, generalized anxiety disorder Medication trials: Clonazepam , trazodone ,  Previous psychiatrist/therapist: None Hospitalizations: Denies Suicide attempts: Denies SIB: Denies Hx of violence towards others: Denies Hx of trauma/abuse: denies  Substance Abuse History in the last 12 months:  Yes.    Past Medical History:  Past Medical History:  Diagnosis Date   Asthma    GERD (gastroesophageal reflux disease)    Kidney stones    Open ankle fracture 11/13/2014    Past Surgical History:  Procedure Laterality Date   I & D EXTREMITY Right 11/13/2014   Procedure:  IRRIGATION AND DEBRIDEMENT RIGHT ANKLE, complex wound closure, placement of Vac, and splinting of right ankle ;  Surgeon: Donaciano Sprang, MD;  Location: [facility] OR;  Service: Orthopedics;  Laterality: Right;   ORIF ANKLE FRACTURE Right 11/23/2014   Procedure: OPEN REDUCTION INTERNAL FIXATION (ORIF) RIGHT  BIMALLEOLAR ANKLE FRACTURE;  Surgeon: Norleen Armor, MD;  Location: [facility] OR;  Service: Orthopedics;  Laterality: Right;   WISDOM TOOTH EXTRACTION      Family Psychiatric History: father significant substance use  Family History:  Family History  Problem Relation Age of Onset   Healthy Mother    Healthy Father     Social History:   Living: *** Occupation: Metallurgist Relationship: *** Children: *** Support: *** Legal History: ***  Social History   Socioeconomic History   Marital status: Single    Spouse name: Not on file   Number of children: Not on file  Years of education: Not on file   Highest education level: Not on file  Occupational History   Not on file  Tobacco Use   Smoking status: Every Day    Types: E-cigarettes   Smokeless tobacco: Never  Substance and  Sexual Activity   Alcohol use: Not Currently    Alcohol/week: 4.0 standard drinks of alcohol    Types: 4 Cans of beer per week    Comment: occasional drink   Drug use: Not Currently    Types: Benzodiazepines    Comment: clonazepam    Sexual activity: Yes    Partners: Female    Birth control/protection: Condom    Comment: 1 monogamous  Other Topics Concern   Not on file  Social History Narrative   Not on file   Social Drivers of Health   Financial Resource Strain: Not on file  Food Insecurity: Not on file  Transportation Needs: Not on file  Physical Activity: Not on file  Stress: Not on file  Social Connections: Not on file    Allergies:   Allergies  Allergen Reactions   Shellfish Allergy Swelling    Current Medications: Current Outpatient Medications  Medication Sig Dispense Refill   Buprenorphine  HCl-Naloxone HCl 8-2 MG FILM Place 6 mg of opioid under the tongue daily.     cetirizine (ZYRTEC) 10 MG tablet Take 10 mg by mouth daily.     diazepam  (VALIUM ) 2 MG tablet Take 3 tablets (6 mg total) by mouth daily for 14 days, THEN 2.5 tablets (5 mg total) daily for 14 days. 77 tablet 0   hydrOXYzine  (ATARAX ) 25 MG tablet Take 1 tablet (25 mg total) by mouth 3 (three) times daily as needed. 270 tablet 0   sertraline  (ZOLOFT ) 50 MG tablet Take 1.5 tablets (75 mg total) by mouth daily. 135 tablet 0   traZODone  (DESYREL ) 50 MG tablet Take 0.5-1 tablets (25-50 mg total) by mouth at bedtime as needed for sleep. 90 tablet 0   No current facility-administered medications for this visit.    Objective:  Psychiatric Specialty Exam: General Appearance: appears at stated age, casually dressed and groomed ***  Behavior: pleasant and cooperative ***  Psychomotor Activity: no psychomotor agitation or retardation noted ***  Eye Contact: fair *** Speech: normal amount, volume and fluency ***   Mood: euthymic *** Affect: congruent, pleasant and interactive ***  Thought Process:  linear, goal directed, no circumstantial or tangential thought process noted, no racing thoughts or flight of ideas *** Descriptions of Associations: intact ***  Thought Content Hallucinations: denies AH, VH , does not appear responding to stimuli *** Delusions: no paranoia, delusions of control, grandeur, ideas of reference, thought broadcasting, and magical thinking *** Suicidal Thoughts: denies SI, intention, plan *** Homicidal Thoughts: denies HI, intention, plan ***  Alertness/Orientation: alert and fully oriented ***  Insight: fair*** Judgment: fair***  Memory: intact ***  Executive Functions  Concentration: intact *** Attention Span: fair *** Recall: intact *** Fund of Knowledge: fair ***  Physical Exam *** General: Pleasant, well-appearing ***. No acute distress. Pulmonary: Normal effort. No wheezing or rales. Skin: No obvious rash or lesions. Neuro: A&Ox3.No focal deficit.  Review of Systems *** No reported symptoms   Screenings:  GAD-7    Flowsheet Row Office Visit from 01/24/2023 in [facility] [place] [facility] Office Visit from 01/03/2023 in [facility] Office Visit from 12/16/2022 in [facility] Office Visit from 08/19/2022 in [facility] [place]  [facility] Office Visit from 05/17/2022 in [facility]  Total GAD-7 Score 4 4 6  0 3   PHQ2-9    Flowsheet Row Office Visit from 01/24/2023 in [facility] [place] [facility] Office Visit from 01/03/2023 in [facility] [place] [facility] Office Visit from 12/16/2022 in [facility] Office Visit from 08/19/2022 in [facility] [place] [facility] Office Visit from 05/17/2022 in [facility]  PHQ-2 Total Score 0 0 2 0 0  PHQ-9 Total Score 5 2 5 5 5    Flowsheet Row UC from 09/11/2021 in [facility] RISK  CATEGORY No Risk    Ismael Franco, MD PGY-3 Psychiatry Resident

## 2023-08-24 ENCOUNTER — Ambulatory Visit (HOSPITAL_COMMUNITY): Admitting: Psychiatry

## 2023-08-25 NOTE — Progress Notes (Signed)
 Psychiatric Follow Up Adult Assessment  Patient Identification: John Lozano MRN:  [id number] Referral Source: Kayla Jeoffrey RAMAN, FNP    Assessment:  John Lozano is a 43 y.o. male with a history of opiate use disorder, sedative use disorder, asthma, erectile dysfunction, chronic hepatitis C who presents in person to [facility] for medication management. Based on initial assessment, his symptoms are consistent with OUD, GAD, and Benzodiazepine dependence.   Patient doing well with managing his stressors of a newborn and balancing his job. He was unable to decrease his Valium  from 6 to 5 mg since last visit and states it was due to worsening symptoms of malaise and headache. We discussed that we will do a slower taper rate to 5.5 mg for two weeks then 5 mg for two weeks with the ultimate goal to be off Valium  by the end of the year and he is motivated. To aid with a possible increase in anxiety, will increase his Zoloft  to 100 mg. We will f/u in 4 weeks to assess progress.  Plan:  # Generalized anxiety disorder Past medication trials: clonazepam  (dependence) Interventions: -- increase sertraline  to 100 mg -- continue hydroxyzine  25 mg 3 times daily as needed for anxiety -- continue trazodone  25 mg nightly as needed for insomnia  # Benzodiazepine dependence Past medication trials: clonazepam  (rebound anxiety at lower doses) Interventions: -- Decrease Valium  to 5.5 mg for 2 weeks and then 5 mg for 2 weeks (previously 7 mg -> 6 mg)  -PDMP reviewed today, shows appropriate filling- last filled on 6/24 for 28 day supply  # Opioid Use Disorder, in sustained remission -- Suboxone  mgmt per PCP  # Alcohol Use Disorder -- last use: last week -- consider naltrexone if alcohol craving worsens   Patient was given contact information for behavioral health clinic and was instructed to call 911 for emergencies.    Patient and plan of care will be discussed with  the Attending MD, who agrees with the above statement and plan.   Subjective:  Chief Complaint: Medication Management  Interval History: Patient seen alone.  Patient reports feeling better today. He states he is currently on 6 mg of Valium . He notices withdrawal symptoms of headache one week after the decrease.of his Valium .   Patient reports fair sleep, he states its a bit off due to his 1 months old. Patient reports good appetite, reporting 2-3 meals daily. He states switched to Valium  about a month ago. His goal is to get off benzo at the end of the year.    Patient denies current SI, HI, and AVH.   Stressors include work, newborn.   Substance use: vaping daily- 1 cartridge, 1-2 beers about twice monthly, denies illicit substances   Past Psychiatric History:  Diagnoses: Benzodiazepine dependence, generalized anxiety disorder Medication trials: Clonazepam , trazodone . Has been on benzos for 4 years Previous psychiatrist/therapist: Previously saw Dr. Prentice Espy Hospitalizations: Denies Suicide attempts: Denies SIB: Denies Hx of violence towards others: Denies Hx of trauma/abuse: denies  Substance Abuse History in the last 12 months:  Yes.    Past Medical History:  Past Medical History:  Diagnosis Date   Asthma    GERD (gastroesophageal reflux disease)    Kidney stones    Open ankle fracture 11/13/2014    Past Surgical History:  Procedure Laterality Date   I & D EXTREMITY Right 11/13/2014   Procedure:  IRRIGATION AND DEBRIDEMENT RIGHT ANKLE, complex wound closure, placement of Vac, and splinting of right  ankle ;  Surgeon: Donaciano Sprang, MD;  Location: [facility] OR;  Service: Orthopedics;  Laterality: Right;   ORIF ANKLE FRACTURE Right 11/23/2014   Procedure: OPEN REDUCTION INTERNAL FIXATION (ORIF) RIGHT  BIMALLEOLAR ANKLE FRACTURE;  Surgeon: Norleen Armor, MD;  Location: [facility] OR;  Service: Orthopedics;  Laterality: Right;   WISDOM TOOTH EXTRACTION      Family Psychiatric History:  father significant substance use  Family History:  Family History  Problem Relation Age of Onset   Healthy Mother    Healthy Father     Social History:   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 Relationship: married for 7 months Support: psychiatrist  Social History   Socioeconomic History   Marital status: Single    Spouse name: Not on file   Number of children: Not on file   Years of education: Not on file   Highest education level: Not on file  Occupational History   Not on file  Tobacco Use   Smoking status: Every Day    Types: E-cigarettes   Smokeless tobacco: Never  Substance and Sexual Activity   Alcohol use: Not Currently    Alcohol/week: 4.0 standard drinks of alcohol    Types: 4 Cans of beer per week    Comment: occasional drink   Drug use: Not Currently    Types: Benzodiazepines    Comment: clonazepam    Sexual activity: Yes    Partners: Female    Birth control/protection: Condom    Comment: 1 monogamous  Other Topics Concern   Not on file  Social History Narrative   Not on file   Social Drivers of Health   Financial Resource Strain: Not on file  Food Insecurity: Not on file  Transportation Needs: Not on file  Physical Activity: Not on file  Stress: Not on file  Social Connections: Not on file    Allergies:   Allergies  Allergen Reactions   Shellfish Allergy Swelling    Current Medications: Current Outpatient Medications  Medication Sig Dispense Refill   Buprenorphine  HCl-Naloxone HCl 8-2 MG FILM Place 6 mg of opioid under the tongue daily.     cetirizine (ZYRTEC) 10 MG tablet Take 10 mg by mouth daily.     diazepam  (VALIUM ) 2 MG tablet Take 3 tablets (6 mg total) by mouth daily for 14 days, THEN 2.5 tablets (5 mg total) daily for 14 days. 77 tablet 0   hydrOXYzine  (ATARAX ) 25 MG tablet Take 1 tablet (25 mg total) by mouth 3 (three) times daily as needed. 270 tablet 0   sertraline  (ZOLOFT ) 50 MG  tablet Take 1.5 tablets (75 mg total) by mouth daily. 135 tablet 0   traZODone  (DESYREL ) 50 MG tablet Take 0.5-1 tablets (25-50 mg total) by mouth at bedtime as needed for sleep. 90 tablet 0   No current facility-administered medications for this visit.    Objective:  Psychiatric Specialty Exam: General Appearance: appears at stated age, casually dressed and groomed   Behavior: pleasant and cooperative   Psychomotor Activity: no psychomotor agitation or retardation noted   Eye Contact: fair  Speech: normal amount, volume and fluency    Mood: euthymic  Affect: congruent, pleasant and interactive   Thought Process: linear, goal directed, no circumstantial or tangential thought process noted, no racing thoughts or flight of ideas  Descriptions of Associations: intact   Thought Content Hallucinations: denies AH, VH , does not appear responding to stimuli  Delusions: no paranoia, delusions of control, grandeur, ideas  of reference, thought broadcasting, and magical thinking  Suicidal Thoughts: denies SI, intention, plan  Homicidal Thoughts: denies HI, intention, plan   Alertness/Orientation: alert and fully oriented   Insight: fair Judgment: fair  Memory: intact   Executive Functions  Concentration: intact  Attention Span: fair  Recall: intact  Fund of Knowledge: fair   Physical Exam  General: Pleasant, well-appearing . No acute distress. Pulmonary: Normal effort. No wheezing or rales. Skin: No obvious rash or lesions. Neuro: A&Ox3.No focal deficit.  Review of Systems No reported symptoms   Screenings:  GAD-7    Flowsheet Row Office Visit from 01/24/2023 in [facility] [place] [place] [facility] Office Visit from 01/03/2023 in [facility] [place] [facility] Office Visit from 12/16/2022 in [facility] [place] [facility] Office Visit from 08/19/2022 in [facility] [place] [facility] Office Visit from 05/17/2022 in [facility]  Total GAD-7 Score 4 4 6  0 3   PHQ2-9    Flowsheet Row Office Visit from 01/24/2023 in [facility] [place] [facility] Office Visit from 01/03/2023 in [facility] [place] [facility] Office Visit from 12/16/2022 in [facility] Office Visit from 08/19/2022 in [facility] [place] [facility] Office Visit from 05/17/2022 in [facility] [place] [facility]  PHQ-2 Total Score 0 0 2 0 0  PHQ-9 Total Score 5 2 5 5 5    Flowsheet Row UC from 09/11/2021 in [facility] RISK CATEGORY No Risk    Ismael Franco, MD PGY-3 Psychiatry Resident

## 2023-08-31 ENCOUNTER — Ambulatory Visit (HOSPITAL_BASED_OUTPATIENT_CLINIC_OR_DEPARTMENT_OTHER): Payer: PRIVATE HEALTH INSURANCE | Admitting: Psychiatry

## 2023-08-31 VITALS — BP 122/81

## 2023-08-31 DIAGNOSIS — F411 Generalized anxiety disorder: Secondary | ICD-10-CM | POA: Diagnosis not present

## 2023-08-31 MED ORDER — DIAZEPAM 2 MG PO TABS: 5.0000 mg | ORAL_TABLET | Freq: Every day | ORAL | 0 refills | Status: DC

## 2023-08-31 MED ORDER — SERTRALINE HCL 100 MG PO TABS: 100.0000 mg | ORAL_TABLET | Freq: Every day | ORAL | 1 refills | Status: DC

## 2023-09-20 NOTE — Progress Notes (Unsigned)
 Psychiatric Follow Up Adult Assessment  Patient Identification: John Lozano MRN:  [id number] Referral Source: Kayla Jeoffrey RAMAN, FNP   Assessment:  Previously discussed doing a slower taper rate to 5.5 mg for two weeks then 5 mg for two weeks with the ultimate goal to be off Valium  by the end of the year, increased his Zoloft  to 100 mg. Patient is doing well today, feels depression is under control and main issue is anxiety. Coping with life stressors of newborn and work well. Patient reports decreasing to 5 mg appropriately with some days having anxiety and fatigue but overall tolerated well. He is agreeable to decreasing again in two weeks to 4 mg. Discussed breathing technique to utilize when he is feeling anxious and he was receptive. He is also going to schedule a sleep study. Will f/u in 6 weeks.   Plan:  # Generalized anxiety disorder Past medication trials: clonazepam  (dependence), hydroxyzine  (ineffective) Interventions: -- Continue Zoloft  100 mg -- Continue trazodone  25 mg nightly as needed for insomnia  # Benzodiazepine dependence Past medication trials: clonazepam  (rebound anxiety at lower doses) Interventions: -- Continue Valium  5 mg for 2 weeks -> Decrease to 4 mg (previously 7 mg -> 6 mg -> 5.5 -> 5 mg)  -PDMP reviewed today  # Opioid Use Disorder, in sustained remission -- Suboxone  mgmt per PCP  # Alcohol Use Disorder -- last use: over a month -- consider naltrexone if alcohol craving worsens   Patient was given contact information for behavioral health clinic and was instructed to call 911 for emergencies.    Patient and plan of care will be discussed with the Attending MD, who agrees with the above statement and plan.   Identifying Information: John Lozano is a 43 y.o. male with a history of opiate use disorder, sedative use disorder, asthma, erectile dysfunction, chronic hepatitis C who presents in person to [facility] for  medication management. Based on initial assessment, his symptoms are consistent with OUD, GAD, and Benzodiazepine dependence.    Subjective:  Chief Complaint: Medication Management  Interval History: Patient seen alone.  Patient reports feeling good today. Since the previous visit, he does note some anxiety 1-2 times in the week. He reports coping well through the anxiety. He is planning on doing a sleep study, awaiting scheduling. Regarding medications, patient notes depression is controlled. Patient reports the following adverse effects: fatigued and anxiety when decreasing Valium .   Patient reports fair sleep, reporting 6-8 hours of sleep nightly. His wife states that pt snores and has periods of breathing cessation.  Patient reports good appetite.  Patient denies current SI, HI, and AVH.   Stressors include work, newborn. He reports doing well caring for his child.   Substance use: vaping daily- 1 cartridge in a week, about 1 beers about twice monthly, denies illicit substances  Planning on switching nicotine pouches soon.    Past Psychiatric History:  Diagnoses: Benzodiazepine dependence, generalized anxiety disorder Medication trials: Clonazepam , trazodone . Has been on benzos for 4 years Previous psychiatrist/therapist: Previously saw Dr. Prentice Espy Hospitalizations: Denies Suicide attempts: Denies SIB: Denies Hx of violence towards others: Denies Hx of trauma/abuse: denies  Substance Abuse History in the last 12 months:  Yes.     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 Relationship: married for 7 months Support: psychiatrist  Past Medical History:  Past Medical History:  Diagnosis Date  Asthma    GERD (gastroesophageal reflux disease)    Kidney stones    Open ankle fracture 11/13/2014    Past Surgical History:  Procedure Laterality Date    I & D EXTREMITY Right 11/13/2014   Procedure:  IRRIGATION AND DEBRIDEMENT RIGHT ANKLE, complex wound closure, placement of Vac, and splinting of right ankle ;  Surgeon: Donaciano Sprang, MD;  Location: [facility] OR;  Service: Orthopedics;  Laterality: Right;   ORIF ANKLE FRACTURE Right 11/23/2014   Procedure: OPEN REDUCTION INTERNAL FIXATION (ORIF) RIGHT  BIMALLEOLAR ANKLE FRACTURE;  Surgeon: Norleen Armor, MD;  Location: [facility] OR;  Service: Orthopedics;  Laterality: Right;   WISDOM TOOTH EXTRACTION       Social History   Socioeconomic History   Marital status: Single    Spouse name: Not on file   Number of children: Not on file   Years of education: Not on file   Highest education level: Not on file  Occupational History   Not on file  Tobacco Use   Smoking status: Every Day    Types: E-cigarettes   Smokeless tobacco: Never  Substance and Sexual Activity   Alcohol use: Not Currently    Alcohol/week: 4.0 standard drinks of alcohol    Types: 4 Cans of beer per week    Comment: occasional drink   Drug use: Not Currently    Types: Benzodiazepines    Comment: clonazepam    Sexual activity: Yes    Partners: Female    Birth control/protection: Condom    Comment: 1 monogamous  Other Topics Concern   Not on file  Social History Narrative   Not on file   Social Drivers of Health   Financial Resource Strain: Not on file  Food Insecurity: Not on file  Transportation Needs: Not on file  Physical Activity: Not on file  Stress: Not on file  Social Connections: Not on file    Allergies:   Allergies  Allergen Reactions   Shellfish Allergy Swelling    Current Medications: Current Outpatient Medications  Medication Sig Dispense Refill   Buprenorphine  HCl-Naloxone HCl 8-2 MG FILM Place 6 mg of opioid under the tongue daily.     cetirizine (ZYRTEC) 10 MG tablet Take 10 mg by mouth daily.     diazepam  (VALIUM ) 2 MG tablet Take 2.5 tablets (5 mg total) by mouth daily for 28 days. Take 2.5  tablets for 2 weeks then decrease to 2 tablets for 2 weeks 70 tablet 0   hydrOXYzine  (ATARAX ) 25 MG tablet Take 1 tablet (25 mg total) by mouth 3 (three) times daily as needed. 270 tablet 0   sertraline  (ZOLOFT ) 100 MG tablet Take 1 tablet (100 mg total) by mouth daily. 30 tablet 1   traZODone  (DESYREL ) 50 MG tablet Take 0.5-1 tablets (25-50 mg total) by mouth at bedtime as needed for sleep. 90 tablet 0   No current facility-administered medications for this visit.    Objective:  Psychiatric Specialty Exam: General Appearance: appears at stated age, casually dressed and groomed   Behavior: pleasant and cooperative   Psychomotor Activity: no psychomotor agitation or retardation noted   Eye Contact: fair  Speech: normal amount, volume and fluency    Mood: euthymic  Affect: congruent, pleasant and interactive   Thought Process: linear, goal directed, no circumstantial or tangential thought process noted, no racing thoughts or flight of ideas  Descriptions of Associations: intact   Thought Content Hallucinations: denies AH, VH , does not appear responding to stimuli  Delusions: no paranoia, delusions of control, grandeur, ideas of reference, thought broadcasting, and magical thinking  Suicidal Thoughts: denies SI, intention, plan  Homicidal Thoughts: denies HI, intention, plan   Alertness/Orientation: alert and fully oriented   Insight: fair Judgment: fair  Memory: intact   Executive Functions  Concentration: intact  Attention Span: fair  Recall: intact  Fund of Knowledge: fair   Physical Exam  General: Pleasant, well-appearing. No acute distress. Pulmonary: Normal effort. No wheezing or rales. Skin: No obvious rash or lesions. Neuro: A&Ox3.No focal deficit.  Review of Systems  No reported symptoms    Screenings:  GAD-7    Flowsheet Row Office Visit from 01/24/2023 in [facility] Office Visit from 01/03/2023 in [facility] Office Visit from 12/16/2022 in [facility] Office Visit from 08/19/2022 in [facility] Office Visit from 05/17/2022 in [facility]  Total GAD-7 Score 4 4 6  0 3   PHQ2-9    Flowsheet Row Office Visit from 01/24/2023 in [facility] [place] [facility] Office Visit from 01/03/2023 in [facility] [place] [facility] Office Visit from 12/16/2022 in [facility] [place] [facility] Office Visit from 08/19/2022 in [facility] [place] [facility] Office Visit from 05/17/2022 in  [facility]  PHQ-2 Total Score 0 0 2 0 0  PHQ-9 Total Score 5 2 5 5 5    Flowsheet Row UC from 09/11/2021 in [facility] RISK CATEGORY No Risk    Ismael Franco, MD PGY-3 Psychiatry Resident   Televisit via video: I connected with John Lozano on 8/20 at  2:45 PM EDT by a video enabled telemedicine application and verified that I am speaking with the correct person using two identifiers.  Location: Patient: Home Provider: Office   I discussed the limitations of evaluation and management by telemedicine and the availability of in person appointments. The patient expressed understanding and agreed to proceed.  I discussed the assessment and treatment plan with the patient. The patient was provided an opportunity to ask questions and all were answered. The patient agreed with the plan and demonstrated an understanding of the instructions.   The patient was advised to call back or seek an in-person evaluation if the symptoms worsen or if the condition fails to improve as anticipated.

## 2023-09-27 ENCOUNTER — Other Ambulatory Visit: Payer: Self-pay | Admitting: Family Medicine

## 2023-09-27 ENCOUNTER — Ambulatory Visit: Admitting: Family Medicine

## 2023-09-27 ENCOUNTER — Encounter: Payer: Self-pay | Admitting: Family Medicine

## 2023-09-27 VITALS — BP 120/85 | HR 67 | Temp 98.8°F | Ht 70.0 in | Wt 191.0 lb

## 2023-09-27 DIAGNOSIS — Z9189 Other specified personal risk factors, not elsewhere classified: Secondary | ICD-10-CM

## 2023-09-27 DIAGNOSIS — Z0000 Encounter for general adult medical examination without abnormal findings: Secondary | ICD-10-CM

## 2023-09-27 NOTE — Progress Notes (Signed)
 Subjective:  HPI: John Lozano is a 43 y.o. male presenting on 09/27/2023 for Medical Management of Chronic Issues (Discuss referral/ Feels he may have sleep apnea. He is tired, not sleeping well, his wife is stating he stops breathing during the night )   HPI Patient is in today for feeling tired during the day, snoring at night, partner noticed he stops breathing at night. Would like referral for sleep apnea testing. No HTN or antihypertensives.  STOP BANG 4.  Review of Systems  All other systems reviewed and are negative.   Relevant past medical history reviewed and updated as indicated.   Past Medical History:  Diagnosis Date   Asthma    GERD (gastroesophageal reflux disease) Not sure   Kidney stones    Open ankle fracture 11/13/2014     Past Surgical History:  Procedure Laterality Date   I & D EXTREMITY Right 11/13/2014   Procedure:  IRRIGATION AND DEBRIDEMENT RIGHT ANKLE, complex wound closure, placement of Vac, and splinting of right ankle ;  Surgeon: Donaciano Sprang, MD;  Location: [facility] OR;  Service: Orthopedics;  Laterality: Right;   ORIF ANKLE FRACTURE Right 11/23/2014   Procedure: OPEN REDUCTION INTERNAL FIXATION (ORIF) RIGHT  BIMALLEOLAR ANKLE FRACTURE;  Surgeon: Norleen Armor, MD;  Location: [facility] OR;  Service: Orthopedics;  Laterality: Right;   WISDOM TOOTH EXTRACTION      Allergies and medications reviewed and updated.   Current Outpatient Medications:    Buprenorphine  HCl-Naloxone HCl 8-2 MG FILM, Place 6 mg of opioid under the tongue daily., Disp: , Rfl:    cetirizine (ZYRTEC) 10 MG tablet, Take 10 mg by mouth daily., Disp: , Rfl:    diazepam  (VALIUM ) 2 MG tablet, Take 2.5 tablets (5 mg total) by mouth daily for 28 days. Take 2.5 tablets for 2 weeks then decrease to 2 tablets for 2 weeks, Disp: 70 tablet, Rfl: 0   sertraline  (ZOLOFT ) 100 MG tablet, Take 1 tablet (100 mg total) by mouth daily., Disp: 30 tablet, Rfl: 1   traZODone  (DESYREL ) 50 MG tablet, Take  0.5-1 tablets (25-50 mg total) by mouth at bedtime as needed for sleep., Disp: 90 tablet, Rfl: 0   hydrOXYzine  (ATARAX ) 25 MG tablet, Take 1 tablet (25 mg total) by mouth 3 (three) times daily as needed. (Patient not taking: Reported on 09/27/2023), Disp: 270 tablet, Rfl: 0  Allergies  Allergen Reactions   Shellfish Allergy Swelling   Cats Claw (Uncaria Tomentosa) Palpitations    Objective:   BP 120/85 (BP Location: Left Arm, Patient Position: Sitting, Cuff Size: Normal)   Pulse 67   Temp 98.8 F (37.1 C)   Ht 5' 10 (1.778 m)   Wt 191 lb (86.6 kg)   SpO2 97%   BMI 27.41 kg/m      09/27/2023    4:23 PM 08/31/2023    2:57 PM 01/24/2023    2:57 PM  Vitals with BMI  Height 5' 10  5' 10  Weight 191 lbs  171 lbs  BMI 27.41  24.54  Systolic 120  120  Diastolic 85  70  Pulse 67  96     Information is confidential and restricted. Go to Review Flowsheets to unlock data.     Physical Exam Vitals and nursing note reviewed.  Constitutional:      Appearance: Normal appearance. He is normal weight.  HENT:     Head: Normocephalic and atraumatic.  Cardiovascular:     Rate and Rhythm: Normal rate and regular  rhythm.     Pulses: Normal pulses.     Heart sounds: Normal heart sounds.  Pulmonary:     Effort: Pulmonary effort is normal.     Breath sounds: Normal breath sounds.  Skin:    General: Skin is warm and dry.     Capillary Refill: Capillary refill takes less than 2 seconds.  Neurological:     General: No focal deficit present.     Mental Status: He is alert and oriented to person, place, and time. Mental status is at baseline.  Psychiatric:        Mood and Affect: Mood normal.        Behavior: Behavior normal.        Thought Content: Thought content normal.        Judgment: Judgment normal.     Assessment & Plan:  At risk for sleep apnea -     Ambulatory referral to Sleep Studies  Physical exam, annual -     CBC with Differential/Platelet; Future -      Comprehensive metabolic panel with GFR; Future -     Lipid panel; Future -     TSH; Future     Follow up plan: Return for annual physical with labs 1 week prior.  Jeoffrey GORMAN Barrio, FNP

## 2023-09-28 ENCOUNTER — Telehealth (HOSPITAL_BASED_OUTPATIENT_CLINIC_OR_DEPARTMENT_OTHER): Payer: PRIVATE HEALTH INSURANCE | Admitting: Psychiatry

## 2023-09-28 DIAGNOSIS — F411 Generalized anxiety disorder: Secondary | ICD-10-CM | POA: Diagnosis not present

## 2023-09-28 DIAGNOSIS — G4700 Insomnia, unspecified: Secondary | ICD-10-CM

## 2023-09-28 LAB — CBC WITH DIFFERENTIAL/PLATELET
Absolute Lymphocytes: 1885 {cells}/uL (ref 850–3900)
Absolute Monocytes: 585 {cells}/uL (ref 200–950)
Basophils Absolute: 30 {cells}/uL (ref 0–200)
Basophils Relative: 0.6 %
Eosinophils Absolute: 50 {cells}/uL (ref 15–500)
Eosinophils Relative: 1 %
HCT: 40.5 % (ref 38.5–45.0)
Hemoglobin: 13.5 g/dL (ref 13.2–15.5)
MCH: 32.1 pg (ref 27.0–33.0)
MCHC: 33.3 g/dL (ref 32.0–36.0)
MCV: 96.2 fL (ref 80.0–100.0)
MPV: 9.7 fL (ref 7.5–12.5)
Monocytes Relative: 11.7 %
Neutro Abs: 2450 {cells}/uL (ref 1500–7800)
Neutrophils Relative %: 49 %
Platelets: 286 Thousand/uL (ref 140–400)
RBC: 4.21 Million/uL (ref 4.20–5.10)
RDW: 13 % (ref 11.0–15.0)
Total Lymphocyte: 37.7 %
WBC: 5 Thousand/uL (ref 3.8–10.8)

## 2023-09-28 LAB — LIPID PANEL
Cholesterol: 185 mg/dL (ref <200)
HDL: 51 mg/dL
LDL Cholesterol (Calc): 102 mg/dL — ABNORMAL HIGH
Non-HDL Cholesterol (Calc): 134 mg/dL — ABNORMAL HIGH (ref <130)
Total CHOL/HDL Ratio: 3.6 (calc) (ref <5.0)
Triglycerides: 198 mg/dL — ABNORMAL HIGH (ref <150)

## 2023-09-28 LAB — COMPREHENSIVE METABOLIC PANEL WITH GFR
AG Ratio: 2 (calc) (ref 1.0–2.5)
ALT: 12 U/L
AST: 18 U/L (ref 10–40)
Albumin: 4.8 g/dL (ref 3.6–5.1)
Alkaline phosphatase (APISO): 44 U/L
BUN: 13 mg/dL (ref 7–25)
CO2: 27 mmol/L (ref 20–32)
Calcium: 9.4 mg/dL
Chloride: 101 mmol/L (ref 98–110)
Creat: 0.95 mg/dL
Globulin: 2.4 g/dL (ref 1.9–3.7)
Glucose, Bld: 83 mg/dL (ref 65–99)
Potassium: 4.1 mmol/L (ref 3.5–5.3)
Sodium: 138 mmol/L (ref 135–146)
Total Bilirubin: 0.7 mg/dL (ref 0.2–1.2)
Total Protein: 7.2 g/dL (ref 6.1–8.1)

## 2023-09-28 LAB — TSH: TSH: 0.92 m[IU]/L (ref 0.40–4.50)

## 2023-09-28 LAB — SPECIMEN COMPROMISED

## 2023-09-28 LAB — HOUSE ACCOUNT TRACKING

## 2023-09-28 MED ORDER — SERTRALINE HCL 100 MG PO TABS: 100.0000 mg | ORAL_TABLET | Freq: Every day | ORAL | 1 refills | Status: DC

## 2023-09-28 MED ORDER — TRAZODONE HCL 50 MG PO TABS
25.0000 mg | ORAL_TABLET | Freq: Every evening | ORAL | 0 refills | Status: DC | PRN
Start: 2023-09-28 — End: 2023-11-09

## 2023-09-28 MED ORDER — DIAZEPAM 2 MG PO TABS: 5.0000 mg | ORAL_TABLET | Freq: Every day | ORAL | 0 refills | Status: DC

## 2023-09-28 MED ORDER — DIAZEPAM 2 MG PO TABS: 4.0000 mg | ORAL_TABLET | Freq: Every day | ORAL | 1 refills | Status: DC

## 2023-09-28 MED ORDER — DIAZEPAM 2 MG PO TABS: ORAL_TABLET | ORAL | 0 refills | Status: DC

## 2023-09-28 NOTE — Addendum Note (Signed)
 Addended by: IZELLA CARAWAY B on: 09/28/2023 03:41 PM   Modules accepted: Orders

## 2023-09-28 NOTE — Addendum Note (Signed)
 Addended by: CARVIN CROCK on: 09/28/2023 03:36 PM   Modules accepted: Level of Service

## 2023-09-28 NOTE — Assessment & Plan Note (Signed)
 STOP BANG 4. Referral to sleep studies.

## 2023-09-29 ENCOUNTER — Ambulatory Visit (INDEPENDENT_AMBULATORY_CARE_PROVIDER_SITE_OTHER): Admitting: Family Medicine

## 2023-09-29 ENCOUNTER — Ambulatory Visit: Payer: Self-pay | Admitting: Family Medicine

## 2023-09-29 ENCOUNTER — Encounter: Payer: Self-pay | Admitting: Family Medicine

## 2023-09-29 VITALS — BP 126/72 | HR 66 | Temp 98.8°F | Ht 71.0 in | Wt 190.6 lb

## 2023-09-29 DIAGNOSIS — F419 Anxiety disorder, unspecified: Secondary | ICD-10-CM | POA: Diagnosis not present

## 2023-09-29 DIAGNOSIS — Z0001 Encounter for general adult medical examination with abnormal findings: Secondary | ICD-10-CM

## 2023-09-29 DIAGNOSIS — E782 Mixed hyperlipidemia: Secondary | ICD-10-CM

## 2023-09-29 DIAGNOSIS — R197 Diarrhea, unspecified: Secondary | ICD-10-CM

## 2023-09-29 DIAGNOSIS — Z0000 Encounter for general adult medical examination without abnormal findings: Secondary | ICD-10-CM

## 2023-09-29 NOTE — Assessment & Plan Note (Signed)

## 2023-09-29 NOTE — Progress Notes (Signed)
 Complete physical exam  Patient: John Lozano   DOB: August 01, 1980   42 y.o. Male  MRN: [id number]  Subjective:    No chief complaint on file.   John Lozano is a 43 y.o. male who presents today for a complete physical exam. He reports consuming a general diet. The patient does not participate in regular exercise at present. He generally feels well. He reports sleeping poorly. He does not have additional problems to discuss today. PMH includes allergies, asthma, anxiety, ED, opoid use currently on buprenorphine .   Allergies and Asthma: well controlled on zyrtec, no exacerbations  Anxiety: well controlled despite changes at home with birth of his son, tapering valium  with psych, currently taking 5mg  BID, Zoloft  100mg  daily, trazodone  25mg  nightly PRN  History of opoid abuse: on suboxone   Hyperlipidemia: not requiring statin, ASCVD 3%  The 10-year ASCVD risk score (Arnett DK, et al., 2019) is: 3.3%   Values used to calculate the score:     Age: 22 years     Clincally relevant sex: Male     Is Non-Hispanic African American: No     Diabetic: No     Tobacco smoker: Yes     Systolic Blood Pressure: 126 mmHg     Is BP treated: No     HDL Cholesterol: 51 mg/dL     Total Cholesterol: 185 mg/dL  Lipid Panel     Component Value Date/Time   CHOL 185 09/27/2023 0000   TRIG 198 (H) 09/27/2023 0000   HDL 51 09/27/2023 0000   CHOLHDL 3.6 09/27/2023 0000   LDLCALC 102 (H) 09/27/2023 0000        01/24/2023    3:34 PM 01/03/2023    4:43 PM 01/03/2023    4:19 PM 12/16/2022    3:03 PM  GAD 7 : Generalized Anxiety Score  Nervous, Anxious, on Edge 2 1 1 3   Control/stop worrying 0 0 0 1  Worry too much - different things 0 1 1 1   Trouble relaxing 1 1 1 1   Restless 0 0 0 0  Easily annoyed or irritable 1 1 1  0  Afraid - awful might happen 0 0 0 0  Total GAD 7 Score 4 4 4 6   Anxiety Difficulty Not difficult at all Not difficult at all Not difficult at all         Most  recent fall risk assessment:    01/03/2023    4:18 PM  Fall Risk   Falls in the past year? 0  Number falls in past yr: 0  Injury with Fall? 0     Most recent depression screenings:    01/24/2023    3:34 PM 01/03/2023    4:43 PM  PHQ 2/9 Scores  PHQ - 2 Score 0 0  PHQ- 9 Score 5 2    Vision:Not within last year , Dental: Current dental problems, and STD: The patient denies history of sexually transmitted disease.  Patient Active Problem List   Diagnosis Date Noted   Moderate mixed hyperlipidemia not requiring statin therapy 09/29/2023   Anxiety 09/29/2023   Diarrhea 09/29/2023   Insomnia 09/28/2023   At risk for sleep apnea 09/27/2023   GAD (generalized anxiety disorder) 08/31/2023   Klonopin  use disorder, moderate, dependence (HCC) 12/16/2022   Buprenorphine  dependence (HCC) 12/16/2022   Left lower quadrant abdominal pain 08/19/2022   Elevated blood-pressure reading without diagnosis of hypertension 04/28/2022   Physical exam, annual 04/28/2022   Erectile dysfunction 01/14/2021  Need for prophylactic vaccination and inoculation against viral hepatitis 10/30/2020   Chronic hepatitis C without hepatic coma (HCC) 09/10/2020   History of opioid abuse (HCC) 08/21/2020   Past Medical History:  Diagnosis Date   Asthma    GERD (gastroesophageal reflux disease) Not sure   Kidney stones    Open ankle fracture 11/13/2014   Past Surgical History:  Procedure Laterality Date   I & D EXTREMITY Right 11/13/2014   Procedure:  IRRIGATION AND DEBRIDEMENT RIGHT ANKLE, complex wound closure, placement of Vac, and splinting of right ankle ;  Surgeon: Donaciano Sprang, MD;  Location: [facility];  Service: Orthopedics;  Laterality: Right;   ORIF ANKLE FRACTURE Right 11/23/2014   Procedure: OPEN REDUCTION INTERNAL FIXATION (ORIF) RIGHT  BIMALLEOLAR ANKLE FRACTURE;  Surgeon: Norleen Armor, MD;  Location: [facility] OR;  Service: Orthopedics;  Laterality: Right;   WISDOM TOOTH EXTRACTION     Social History    Tobacco Use   Smoking status: Every Day    Types: E-cigarettes   Smokeless tobacco: Never  Substance Use Topics   Alcohol use: Not Currently    Alcohol/week: 4.0 standard drinks of alcohol    Types: 4 Cans of beer per week    Comment: occasional drink   Drug use: Not Currently    Types: Benzodiazepines    Comment: clonazepam    Family History  Problem Relation Age of Onset   Healthy Mother    Healthy Father    Allergies  Allergen Reactions   Shellfish Allergy Swelling   Cats Claw (Uncaria Tomentosa) Palpitations      Patient Care Team: Kayla Jeoffrey RAMAN, FNP as PCP - General (Family Medicine)   Outpatient Medications Prior to Visit  Medication Sig   Buprenorphine  HCl-Naloxone HCl 8-2 MG FILM Place 6 mg of opioid under the tongue daily.   cetirizine (ZYRTEC) 10 MG tablet Take 10 mg by mouth daily.   diazepam  (VALIUM ) 2 MG tablet Take 2.5 tablets (5 mg total) by mouth daily at 2 PM for 14 days, THEN 2 tablets (4 mg total) daily at 2 PM.   sertraline  (ZOLOFT ) 100 MG tablet Take 1 tablet (100 mg total) by mouth daily.   traZODone  (DESYREL ) 50 MG tablet Take 0.5-1 tablets (25-50 mg total) by mouth at bedtime as needed for sleep.   No facility-administered medications prior to visit.    Review of Systems  Constitutional: Negative.   HENT: Negative.    Eyes: Negative.   Respiratory: Negative.    Cardiovascular: Negative.   Gastrointestinal: Negative.   Genitourinary: Negative.   Musculoskeletal: Negative.   Skin: Negative.   Neurological: Negative.   Endo/Heme/Allergies: Negative.   Psychiatric/Behavioral: Negative.    All other systems reviewed and are negative.         Objective:     BP 126/72 (BP Location: Left Arm, Patient Position: Sitting, Cuff Size: Normal)   Pulse 66   Temp 98.8 F (37.1 C) (Oral)   Ht 5' 11 (1.803 m)   Wt 190 lb 9.6 oz (86.5 kg)   SpO2 97%   BMI 26.58 kg/m  BP Readings from Last 3 Encounters:  09/29/23 126/72  09/27/23  120/85  08/31/23 122/81   Wt Readings from Last 3 Encounters:  09/29/23 190 lb 9.6 oz (86.5 kg)  09/27/23 191 lb (86.6 kg)  01/24/23 171 lb (77.6 kg)      Physical Exam Vitals and nursing note reviewed.  Constitutional:      Appearance: Normal appearance. He is normal  weight.  HENT:     Head: Normocephalic and atraumatic.     Right Ear: Tympanic membrane, ear canal and external ear normal.     Left Ear: Tympanic membrane, ear canal and external ear normal.     Nose: Nose normal.     Mouth/Throat:     Mouth: Mucous membranes are moist.     Pharynx: Oropharynx is clear.  Eyes:     Extraocular Movements: Extraocular movements intact.     Right eye: Normal extraocular motion and no nystagmus.     Left eye: Normal extraocular motion and no nystagmus.     Conjunctiva/sclera: Conjunctivae normal.     Pupils: Pupils are equal, round, and reactive to light.  Cardiovascular:     Rate and Rhythm: Normal rate and regular rhythm.     Pulses: Normal pulses.     Heart sounds: Normal heart sounds.  Pulmonary:     Effort: Pulmonary effort is normal.     Breath sounds: Normal breath sounds.  Abdominal:     General: Bowel sounds are normal.     Palpations: Abdomen is soft.  Genitourinary:    Comments: Deferred using shared decision making Musculoskeletal:        General: Normal range of motion.     Cervical back: Normal range of motion and neck supple.  Skin:    General: Skin is warm and dry.     Capillary Refill: Capillary refill takes less than 2 seconds.  Neurological:     General: No focal deficit present.     Mental Status: He is alert. Mental status is at baseline.  Psychiatric:        Mood and Affect: Mood normal.        Speech: Speech normal.        Behavior: Behavior normal.        Thought Content: Thought content normal.        Cognition and Memory: Cognition and memory normal.        Judgment: Judgment normal.      No results found for any visits on 09/29/23. Last  CBC Lab Results  Component Value Date   WBC 5.0 09/27/2023   HGB 13.5 09/27/2023   HCT 40.5 09/27/2023   MCV 96.2 09/27/2023   MCH 32.1 09/27/2023   RDW 13.0 09/27/2023   PLT 286 09/27/2023   Last metabolic panel Lab Results  Component Value Date   GLUCOSE 83 09/27/2023   NA 138 09/27/2023   K 4.1 09/27/2023   CL 101 09/27/2023   CO2 27 09/27/2023   BUN 13 09/27/2023   CREATININE 0.95 09/27/2023   EGFR 107 08/19/2022   CALCIUM 9.4 09/27/2023   PROT 7.2 09/27/2023   ALBUMIN 4.7 02/07/2007   BILITOT 0.7 09/27/2023   ALKPHOS 44 02/07/2007   AST 18 09/27/2023   ALT 12 09/27/2023   ANIONGAP 7 11/12/2014   Last lipids Lab Results  Component Value Date   CHOL 185 09/27/2023   HDL 51 09/27/2023   LDLCALC 102 (H) 09/27/2023   TRIG 198 (H) 09/27/2023   CHOLHDL 3.6 09/27/2023   Last hemoglobin A1c Lab Results  Component Value Date   HGBA1C 5.3 04/22/2022   Last thyroid functions Lab Results  Component Value Date   TSH 0.92 09/27/2023   Last vitamin D No results found for: 25OHVITD2, 25OHVITD3, VD25OH Last vitamin B12 and Folate No results found for: VITAMINB12, FOLATE      Assessment & Plan:    Routine  Health Maintenance and Physical Exam  Immunization History  Administered Date(s) Administered   Influenza Split 02/22/2013   Tdap 12/27/2013, 11/12/2014    Health Maintenance  Topic Date Due   INFLUENZA VACCINE  05/08/2024 (Originally 09/09/2023)   Pneumococcal Vaccine (1 of 2 - PCV) 09/28/2024 (Originally 09/21/2001)   Hepatitis B Vaccines 19-59 Average Risk (1 of 3 - 19+ 3-dose series) 09/28/2024 (Originally 09/21/2001)   HPV VACCINES (1 - 3-dose SCDM series) 09/28/2024 (Originally 09/21/2009)   DTaP/Tdap/Td (3 - Td or Tdap) 11/11/2024   Hepatitis C Screening  Completed   HIV Screening  Completed   Meningococcal B Vaccine  Aged Out   COVID-19 Vaccine  Discontinued    Discussed health benefits of physical activity, and encouraged him to engage  in regular exercise appropriate for his age and condition.  Problem List Items Addressed This Visit     Physical exam, annual - Primary   Today your medical history was reviewed and routine physical exam with labs was performed. Recommend 150 minutes of moderate intensity exercise weekly and consuming a well-balanced diet. Advised to stop smoking if a smoker, avoid smoking if a non-smoker, limit alcohol consumption to 1 drink per day for women and 2 drinks per day for men, and avoid illicit drug use. Counseled on safe sex practices and offered STI testing today. Counseled on the importance of sunscreen use. Counseled in mental health awareness and when to seek medical care. Vaccine maintenance discussed. Appropriate health maintenance items reviewed. Return to office in 1 year for annual physical exam.       Moderate mixed hyperlipidemia not requiring statin therapy   Your labs showed elevated cholesterol. I recommend consuming a heart healthy diet such as Mediterranean diet or DASH diet with whole grains, fruits, vegetable, fish, lean meats, nuts, and olive oil. Limit sweets and processed foods. I also encourage moderate intensity exercise 150 minutes weekly. This is 3-5 times weekly for 30-50 minutes each session. Goal should be pace of 3 miles/hours, or walking 1.5 miles in 30 minutes. The 10-year ASCVD risk score (Arnett DK, et al., 2019) is: 3.3%       Anxiety   Well controlled on current regirmen with psychiatry.       Diarrhea   Chronic. BM x2 daily. No melena, hematochezia, nausea, vomiting. Occasional cramping. Will try elimination diet and notify office if he decides he wants a referral to GI.      Return in about 1 year (around 09/28/2024) for annual physical with labs 1 week prior.     Jeoffrey GORMAN Barrio, FNP

## 2023-09-29 NOTE — Assessment & Plan Note (Signed)
 Well controlled on current regirmen with psychiatry.

## 2023-09-29 NOTE — Assessment & Plan Note (Signed)
 Chronic. BM x2 daily. No melena, hematochezia, nausea, vomiting. Occasional cramping. Will try elimination diet and notify office if he decides he wants a referral to GI.

## 2023-09-29 NOTE — Assessment & Plan Note (Signed)
 Your labs showed elevated cholesterol. I recommend consuming a heart healthy diet such as Mediterranean diet or DASH diet with whole grains, fruits, vegetable, fish, lean meats, nuts, and olive oil. Limit sweets and processed foods. I also encourage moderate intensity exercise 150 minutes weekly. This is 3-5 times weekly for 30-50 minutes each session. Goal should be pace of 3 miles/hours, or walking 1.5 miles in 30 minutes. The 10-year ASCVD risk score (Arnett DK, et al., 2019) is: 3.3%

## 2023-10-27 ENCOUNTER — Institutional Professional Consult (permissible substitution): Admitting: Neurology

## 2023-11-01 NOTE — Progress Notes (Deleted)
 Psychiatric Follow Up Adult Assessment  Patient Identification: John Lozano MRN:  [id number] Referral Source: Kayla Jeoffrey RAMAN, FNP   Assessment:  Patient seen virtually in clinic today. In the prior visit, we discussed decreasing Valium  to 4 mg. ***  Plan:  # Generalized anxiety disorder Past medication trials: clonazepam  (dependence), hydroxyzine  (ineffective) Interventions: -- Continue Zoloft  100 mg -- Continue trazodone  25 mg nightly as needed for insomnia  # Benzodiazepine dependence Past medication trials: clonazepam  (rebound anxiety at lower doses) Interventions: -- Continue Valium  5 mg for 2 weeks -> Decrease to 4 mg (previously 7 mg -> 6 mg -> 5.5 -> 5 mg)  -PDMP reviewed ***  # Opioid Use Disorder, in sustained remission -- Suboxone  mgmt per PCP  # Alcohol Use Disorder -- last use: over a month -- consider naltrexone if alcohol craving worsens   Patient was given contact information for behavioral health clinic and was instructed to call 911 for emergencies.    Patient and plan of care will be discussed with the Attending MD, who agrees with the above statement and plan.   Identifying Information: John Lozano is a 43 y.o. male with a history of opiate use disorder, sedative use disorder, asthma, erectile dysfunction, chronic hepatitis C who presents in person to [facility] for medication management. Based on initial assessment, his symptoms are consistent with OUD, GAD, and Benzodiazepine dependence.    Subjective:  Chief Complaint: Medication Management  Interval History: Patient seen ***.  Patient reports feeling *** today. Since the previous visit, ***. Stressors include ***.   Regarding psychiatric symptoms, ***. Patient reports the medications are ***. Patient reports the following adverse effects: ***.   Patient reports *** sleep, ***. Patient reports *** appetite, ***.   Patient denies current SI, HI, and AVH.  ***  Substance use: *** vaping daily- 1 cartridge in a week, about 1 beers about twice monthly, denies illicit substances  Planning on switching nicotine pouches soon.    Past Psychiatric History:  Diagnoses: Benzodiazepine dependence, generalized anxiety disorder Medication trials: Clonazepam , trazodone . Has been on benzos for 4 years Previous psychiatrist/therapist: Previously saw Dr. Prentice Espy Hospitalizations: Denies Suicide attempts: Denies SIB: Denies Hx of violence towards others: Denies Hx of trauma/abuse: denies  Substance Abuse History in the last 12 months:  Yes.     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 Relationship: married for 7 months Support: psychiatrist  Past Medical History:  Past Medical History:  Diagnosis Date   Asthma    GERD (gastroesophageal reflux disease) Not sure   Kidney stones    Open ankle fracture 11/13/2014    Past Surgical History:  Procedure Laterality Date   I & D EXTREMITY Right 11/13/2014   Procedure:  IRRIGATION AND DEBRIDEMENT RIGHT ANKLE, complex wound closure, placement of Vac, and splinting of right ankle ;  Surgeon: Donaciano Sprang, MD;  Location: [facility] OR;  Service: Orthopedics;  Laterality: Right;   ORIF ANKLE FRACTURE Right 11/23/2014   Procedure: OPEN REDUCTION INTERNAL FIXATION (ORIF) RIGHT  BIMALLEOLAR ANKLE FRACTURE;  Surgeon: Norleen Armor, MD;  Location: [facility] OR;  Service: Orthopedics;  Laterality: Right;   WISDOM TOOTH EXTRACTION       Social History   Socioeconomic History   Marital status: Single    Spouse name: Not on file   Number of children: Not on file   Years of education: Not on file  Highest education level: Not on file  Occupational History   Not on file  Tobacco Use   Smoking status: Every Day    Types: E-cigarettes   Smokeless tobacco: Never  Substance and Sexual Activity    Alcohol use: Not Currently    Alcohol/week: 4.0 standard drinks of alcohol    Types: 4 Cans of beer per week    Comment: occasional drink   Drug use: Not Currently    Types: Benzodiazepines    Comment: clonazepam    Sexual activity: Yes    Partners: Female    Birth control/protection: Condom    Comment: 1 monogamous  Other Topics Concern   Not on file  Social History Narrative   Not on file   Social Drivers of Health   Financial Resource Strain: Not on file  Food Insecurity: Not on file  Transportation Needs: Not on file  Physical Activity: Not on file  Stress: Not on file  Social Connections: Not on file    Allergies:   Allergies  Allergen Reactions   Shellfish Allergy Swelling   Cats Claw (Uncaria Tomentosa) Palpitations    Current Medications: Current Outpatient Medications  Medication Sig Dispense Refill   Buprenorphine  HCl-Naloxone HCl 8-2 MG FILM Place 6 mg of opioid under the tongue daily.     cetirizine (ZYRTEC) 10 MG tablet Take 10 mg by mouth daily.     diazepam  (VALIUM ) 2 MG tablet Take 2.5 tablets (5 mg total) by mouth daily at 2 PM for 14 days, THEN 2 tablets (4 mg total) daily at 2 PM. 95 tablet 0   sertraline  (ZOLOFT ) 100 MG tablet Take 1 tablet (100 mg total) by mouth daily. 30 tablet 1   traZODone  (DESYREL ) 50 MG tablet Take 0.5-1 tablets (25-50 mg total) by mouth at bedtime as needed for sleep. 90 tablet 0   No current facility-administered medications for this visit.    Objective:  Psychiatric Specialty Exam: General Appearance: appears at stated age, casually dressed and groomed ***  Behavior: pleasant and cooperative ***  Psychomotor Activity: no psychomotor agitation or retardation noted ***  Eye Contact: fair *** Speech: normal amount, volume and fluency ***   Mood: euthymic *** Affect: congruent, pleasant and interactive ***  Thought Process: linear, goal directed, no circumstantial or tangential thought process noted, no racing  thoughts or flight of ideas *** Descriptions of Associations: intact ***  Thought Content Hallucinations: denies AH, VH , does not appear responding to stimuli *** Delusions: no paranoia, delusions of control, grandeur, ideas of reference, thought broadcasting, and magical thinking *** Suicidal Thoughts: denies SI, intention, plan *** Homicidal Thoughts: denies HI, intention, plan ***  Alertness/Orientation: alert and fully oriented ***  Insight: fair*** Judgment: fair***  Memory: intact ***  Executive Functions  Concentration: intact *** Attention Span: fair *** Recall: intact *** Fund of Knowledge: fair ***  Physical Exam *** General: Pleasant, well-appearing ***. No acute distress. Pulmonary: Normal effort. No wheezing or rales. Skin: No obvious rash or lesions. Neuro: A&Ox3.No focal deficit.  Review of Systems *** No reported symptoms   Screenings:  GAD-7    Flowsheet Row Office Visit from 01/24/2023 in [facility] [place] [facility] Office Visit from 01/03/2023 in [facility] Office Visit from 12/16/2022 in [facility] Office Visit from 08/19/2022 in [facility] [place] [facility] Office Visit from 05/17/2022 in [facility] [place]  Total GAD-7 Score 4 4 6  0 3  [id number]    Flowsheet Row Office Visit from 01/24/2023 in [facility] [place] [facility] Office Visit from 01/03/2023 in [facility] [place] [facility] Office Visit from 12/16/2022 in [facility] [place] [facility] Office Visit from 08/19/2022 in [facility] [place] [facility] Office Visit from 05/17/2022 in [facility] [place] [facility]  PHQ-2 Total Score 0 0 2 0 0  PHQ-9 Total Score 5 2 5 5 5    Flowsheet Row UC from 09/11/2021 in [facility] RISK CATEGORY No Risk    Ismael Franco, MD PGY-3 Psychiatry Resident   Televisit via  video: I connected with John Lozano on 8/20 at  2:00 PM EDT by a video enabled telemedicine application and verified that I am speaking with the correct person using two identifiers.  Location: Patient: Home Provider: Office   I discussed the limitations of evaluation and management by telemedicine and the availability of in person appointments. The patient expressed understanding and agreed to proceed.  I discussed the assessment and treatment plan with the patient. The patient was provided an opportunity to ask questions and all were answered. The patient agreed with the plan and demonstrated an understanding of the instructions.   The patient was advised to call back or seek an in-person evaluation if the symptoms worsen or if the condition fails to improve as anticipated.

## 2023-11-08 NOTE — Progress Notes (Unsigned)
 Psychiatric Follow Up Adult Assessment  Patient Identification: John Lozano MRN:  [id number] Referral Source: Kayla Jeoffrey RAMAN, FNP  Televisit via video: I connected with John Lozano on 10/1 at  3:00 PM EDT by a video enabled telemedicine application and verified that I am speaking with the correct person using two identifiers.  Location: Patient: office Provider: office   I discussed the limitations of evaluation and management by telemedicine and the availability of in person appointments. The patient expressed understanding and agreed to proceed.  I discussed the assessment and treatment plan with the patient. The patient was provided an opportunity to ask questions and all were answered. The patient agreed with the plan and demonstrated an understanding of the instructions.   The patient was advised to call back or seek an in-person evaluation if the symptoms worsen or if the condition fails to improve as anticipated.;  Assessment:  Patient seen virtually in clinic today. In the prior visit, we discussed decreasing Valium  to 4 mg. Patient is doing well today, denying withdrawal symptoms with the decrease in Valium . Patient's symptoms of anxiety is well controlled at this time amidst his psychosocial stressors. Of note, he is planning to get a sleep study completed, we can touch base about his results in the future appointments. I will continue on his Valium  taper, will have him be on 4 mg for two more weeks and then decrease to 3 mg afterwards. F/u in one month.   Plan:  # Generalized anxiety disorder Past medication trials: clonazepam  (dependence), hydroxyzine  (ineffective) -- Continue Zoloft  100 mg -- Continue trazodone  25 mg nightly as needed for insomnia  # Benzodiazepine dependence Past medication trials: clonazepam  (rebound anxiety at lower doses) Interventions: -- Continue Valium  4 mg (previously 7 mg -> 6 mg -> 5.5 -> 5 mg)  -PDMP reviewed today, filling  appropriately  # Opioid Use Disorder, in sustained remission -- Suboxone  mgmt per PCP  # Alcohol Use Disorder -- last use: over a month   Patient was given contact information for behavioral health clinic and was instructed to call 911 for emergencies.    Patient and plan of care will be discussed with the Attending MD, who agrees with the above statement and plan.   Identifying Information: John Lozano is a 43 y.o. male with a history of opiate use disorder, sedative use disorder, asthma, erectile dysfunction, chronic hepatitis C who presents in person to [facility] for medication management. Based on initial assessment, his symptoms are consistent with OUD, GAD, and Benzodiazepine dependence.    Subjective:  Chief Complaint: Medication Management  Interval History: Patient seen alone.  Patient reports feeling good today. 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   Regarding psychiatric symptoms, he feels anxiety is well controlled. Patient reports going down to 4 mg of Valium  for the past week. He denies significant withdrawal symptoms. He plans on having a sleep apnea completed tonight as he has been feeling fairly tired and having a few seconds at night without breathing per his wife.  Patient reports fair sleep, reporting sometimes waking up in the night due to his baby. Patient reports good appetite.   Patient denies current SI, HI, and AVH.   Substance use:  Vaping daily- 1 cartridge in a week- planning on stopping ~1 beers about twice monthly; denies drinking in the past 2 weeks Denies illicit substances  Past Psychiatric History:  Diagnoses: Benzodiazepine  dependence, generalized anxiety disorder Medication trials: Clonazepam , trazodone . Has been on benzos for 4 years Previous psychiatrist/therapist: Previously saw Dr. Prentice Espy Hospitalizations: Denies Suicide attempts: Denies SIB:  Denies Hx of violence towards others: Denies Hx of trauma/abuse: denies  Substance Abuse History in the last 12 months:  Yes.    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 Relationship: married for 7 months Support: psychiatrist  Past Medical History:  Past Medical History:  Diagnosis Date   Asthma    GERD (gastroesophageal reflux disease) Not sure   Kidney stones    Open ankle fracture 11/13/2014    Past Surgical History:  Procedure Laterality Date   I & D EXTREMITY Right 11/13/2014   Procedure:  IRRIGATION AND DEBRIDEMENT RIGHT ANKLE, complex wound closure, placement of Vac, and splinting of right ankle ;  Surgeon: Donaciano Sprang, MD;  Location: [facility] OR;  Service: Orthopedics;  Laterality: Right;   ORIF ANKLE FRACTURE Right 11/23/2014   Procedure: OPEN REDUCTION INTERNAL FIXATION (ORIF) RIGHT  BIMALLEOLAR ANKLE FRACTURE;  Surgeon: Norleen Armor, MD;  Location: [facility] OR;  Service: Orthopedics;  Laterality: Right;   WISDOM TOOTH EXTRACTION       Social History   Socioeconomic History   Marital status: Single    Spouse name: Not on file   Number of children: Not on file   Years of education: Not on file   Highest education level: Not on file  Occupational History   Not on file  Tobacco Use   Smoking status: Every Day    Types: E-cigarettes   Smokeless tobacco: Never  Vaping Use   Vaping status: Never Used  Substance and Sexual Activity   Alcohol use: Not Currently    Alcohol/week: 4.0 standard drinks of alcohol    Types: 4 Cans of beer per week    Comment: occasional drink   Drug use: Not Currently    Types: Benzodiazepines    Comment: clonazepam    Sexual activity: Yes    Partners: Female    Birth control/protection: Condom    Comment: 1 monogamous  Other Topics Concern   Not on file  Social History Narrative   Pt lives life partner    Pt works     Social Drivers of Corporate investment banker Strain: Not on file  Food Insecurity: Not on file  Transportation Needs: Not on file  Physical Activity: Not on file  Stress: Not on file  Social Connections: Not on file    Allergies:   Allergies  Allergen Reactions   Shellfish Allergy Swelling   Cats Claw (Uncaria Tomentosa) Palpitations    Current Medications: Current Outpatient Medications  Medication Sig Dispense Refill   Buprenorphine  HCl-Naloxone HCl 8-2 MG FILM Place 6 mg of opioid under the tongue daily.     cetirizine (ZYRTEC) 10 MG tablet Take 10 mg by mouth daily.     diazepam  (VALIUM ) 2 MG tablet Take 2.5 tablets (5 mg total) by mouth daily at 2 PM for 14 days, THEN 2 tablets (4 mg total) daily at 2 PM. 95 tablet 0   sertraline  (ZOLOFT ) 100 MG tablet Take 1 tablet (100 mg total) by mouth daily. 30 tablet 1   traZODone  (DESYREL ) 50 MG tablet Take 0.5-1 tablets (25-50 mg total) by mouth at bedtime as needed for sleep. 90 tablet 0   No current facility-administered medications for this visit.    Objective:  Psychiatric Specialty Exam: General Appearance: appears at stated age, casually dressed and groomed   Behavior: pleasant and cooperative   Psychomotor Activity: no psychomotor agitation or retardation noted   Eye Contact: fair  Speech: normal amount, volume and fluency    Mood: euthymic  Affect: congruent, pleasant and interactive   Thought Process: linear, goal directed, no circumstantial or tangential thought process noted, no racing thoughts or flight of ideas  Descriptions of Associations: intact   Thought Content Hallucinations: denies AH, VH , does not appear responding to stimuli  Delusions: no paranoia, delusions of control, grandeur, ideas of reference, thought broadcasting, and magical thinking  Suicidal Thoughts: denies SI, intention, plan  Homicidal Thoughts: denies HI, intention, plan   Alertness/Orientation: alert and fully oriented    Insight: fair Judgment: fair  Memory: intact   Executive Functions  Concentration: intact  Attention Span: fair  Recall: intact  Fund of Knowledge: fair   Physical Exam  General: Pleasant, well-appearing . No acute distress. Pulmonary: Normal effort. No wheezing or rales. Neuro: A&Ox3.No focal deficit.  Review of Systems  No reported symptoms   Screenings:  GAD-7    Flowsheet Row Office Visit from 01/24/2023 in [facility] Office Visit from 01/03/2023 in [facility] Office Visit from 12/16/2022 in [facility] Office Visit from 08/19/2022 in [facility] [place] Office Visit from 05/17/2022 in [facility]  Total GAD-7 Score 4 4 6  0 3   PHQ2-9    Flowsheet Row Office Visit from 01/24/2023 in [facility] [place] [facility] Office Visit from 01/03/2023 in [facility] Office Visit from 12/16/2022 in [facility] Office Visit from 08/19/2022 in [facility] Office Visit from 05/17/2022 in [facility]  PHQ-2 Total Score 0 0 2 0 0  PHQ-9 Total Score 5 2 5 5 5    Flowsheet Row UC from 09/11/2021 in [facility] RISK CATEGORY No Risk    Ismael Franco, MD PGY-3 Psychiatry Resident

## 2023-11-09 ENCOUNTER — Encounter: Payer: Self-pay | Admitting: Neurology

## 2023-11-09 ENCOUNTER — Telehealth (HOSPITAL_COMMUNITY): Admitting: Psychiatry

## 2023-11-09 ENCOUNTER — Ambulatory Visit: Admitting: Neurology

## 2023-11-09 ENCOUNTER — Telehealth (HOSPITAL_BASED_OUTPATIENT_CLINIC_OR_DEPARTMENT_OTHER): Payer: PRIVATE HEALTH INSURANCE | Admitting: Psychiatry

## 2023-11-09 VITALS — BP 124/72 | HR 63 | Ht 71.0 in | Wt 197.0 lb

## 2023-11-09 DIAGNOSIS — R0681 Apnea, not elsewhere classified: Secondary | ICD-10-CM

## 2023-11-09 DIAGNOSIS — R0683 Snoring: Secondary | ICD-10-CM | POA: Diagnosis not present

## 2023-11-09 DIAGNOSIS — G4700 Insomnia, unspecified: Secondary | ICD-10-CM | POA: Diagnosis not present

## 2023-11-09 DIAGNOSIS — F411 Generalized anxiety disorder: Secondary | ICD-10-CM

## 2023-11-09 DIAGNOSIS — F1190 Opioid use, unspecified, uncomplicated: Secondary | ICD-10-CM

## 2023-11-09 DIAGNOSIS — Z9189 Other specified personal risk factors, not elsewhere classified: Secondary | ICD-10-CM

## 2023-11-09 DIAGNOSIS — G4719 Other hypersomnia: Secondary | ICD-10-CM

## 2023-11-09 DIAGNOSIS — Z79899 Other long term (current) drug therapy: Secondary | ICD-10-CM

## 2023-11-09 MED ORDER — TRAZODONE HCL 50 MG PO TABS: 25.0000 mg | ORAL_TABLET | Freq: Every evening | ORAL | 0 refills | Status: DC | PRN

## 2023-11-09 MED ORDER — SERTRALINE HCL 100 MG PO TABS: 100.0000 mg | ORAL_TABLET | Freq: Every day | ORAL | 1 refills | Status: DC

## 2023-11-09 MED ORDER — DIAZEPAM 2 MG PO TABS: ORAL_TABLET | ORAL | 0 refills | Status: DC

## 2023-11-09 NOTE — Addendum Note (Signed)
 Addended by: CARVIN CROCK on: 11/09/2023 03:28 PM   Modules accepted: Level of Service

## 2023-11-09 NOTE — Progress Notes (Signed)
 Subjective:    Patient ID: John Lozano is a 43 y.o. male.  HPI    True Mar, MD, PhD [facility] [address], [address] [address] [place], [place] [address]  Dear John Lozano,  I saw your patient, John Lozano, upon your kind request in my sleep clinic today for initial consultation of his sleep disorder, in particular, concern for underlying obstructive sleep apnea.  The patient is unaccompanied today.  As you know, John Lozano is a 43 year old male with an underlying medical history of asthma, reflux disease, kidney stones, ankle fracture with status post ORIF to the right ankle in 2016, history of opioid use disorder, previously on Suboxone  by prescription, currently on generic Suboxone  without prescription, anxiety, on Valium  and overweight state, who reports snoring and excessive daytime somnolence as well as witnessed apneas per wife's report.  His Epworth sleepiness score is 14 out of 24, fatigue severity score is 45 out of 63.  He was finishing up a virtual visit with another provider on his cell phone when I walked in to the exam room.  I let the patient finish his encounter first.  I reviewed your office note from 09/27/2023. He is currently on Valium  4 mg daily.  He lives with his family including wife and 108-month-old.  They have 3 dogs and 1 cat in the household and the dogs typically sleep in the bedroom with them.  He goes to bed between 10 and 11 and rise time is around 6.  He drinks caffeine in the form of coffee, about 3 cups in the mornings.  He drinks alcohol infrequently, maybe once a month.  He quit smoking about 7 years ago but vapes daily.  He has occasional morning headaches and has nocturia about once per average night.  He is not aware of any family history of sleep apnea.  His Past Medical History Is Significant For: Past Medical History:  Diagnosis Date   Asthma    GERD (gastroesophageal reflux disease) Not sure   Kidney stones     Open ankle fracture 11/13/2014    His Past Surgical History Is Significant For: Past Surgical History:  Procedure Laterality Date   I & D EXTREMITY Right 11/13/2014   Procedure:  IRRIGATION AND DEBRIDEMENT RIGHT ANKLE, complex wound closure, placement of Vac, and splinting of right ankle ;  Surgeon: Donaciano Sprang, MD;  Location: [facility];  Service: Orthopedics;  Laterality: Right;   ORIF ANKLE FRACTURE Right 11/23/2014   Procedure: OPEN REDUCTION INTERNAL FIXATION (ORIF) RIGHT  BIMALLEOLAR ANKLE FRACTURE;  Surgeon: Norleen Armor, MD;  Location: [facility] OR;  Service: Orthopedics;  Laterality: Right;   WISDOM TOOTH EXTRACTION      His Family History Is Significant For: Family History  Problem Relation Age of Onset   Healthy Mother    Healthy Father    Sleep apnea Neg Hx     His Social History Is Significant For: Social History   Socioeconomic History   Marital status: Single    Spouse name: Not on file   Number of children: Not on file   Years of education: Not on file   Highest education level: Not on file  Occupational History   Not on file  Tobacco Use   Smoking status: Every Day    Types: E-cigarettes   Smokeless tobacco: Never  Vaping Use   Vaping status: Never Used  Substance and Sexual Activity   Alcohol use: Not Currently    Alcohol/week: 4.0 standard  drinks of alcohol    Types: 4 Cans of beer per week    Comment: occasional drink   Drug use: Not Currently    Types: Benzodiazepines    Comment: clonazepam    Sexual activity: Yes    Partners: Female    Birth control/protection: Condom    Comment: 1 monogamous  Other Topics Concern   Not on file  Social History Narrative   Pt lives life partner    Pt works    Social Drivers of Corporate investment banker Strain: Not on Ship broker Insecurity: Not on file  Transportation Needs: Not on file  Physical Activity: Not on file  Stress: Not on file  Social Connections: Not on file    His Allergies Are:  Allergies   Allergen Reactions   Shellfish Allergy Swelling   Cats Claw (Uncaria Tomentosa) Palpitations  :   His Current Medications Are:  Outpatient Encounter Medications as of 11/09/2023  Medication Sig   Buprenorphine  HCl-Naloxone HCl 8-2 MG FILM Place 6 mg of opioid under the tongue daily.   cetirizine (ZYRTEC) 10 MG tablet Take 10 mg by mouth daily.   diazepam  (VALIUM ) 2 MG tablet Take 2.5 tablets (5 mg total) by mouth daily at 2 PM for 14 days, THEN 2 tablets (4 mg total) daily at 2 PM.   sertraline  (ZOLOFT ) 100 MG tablet Take 1 tablet (100 mg total) by mouth daily.   traZODone  (DESYREL ) 50 MG tablet Take 0.5-1 tablets (25-50 mg total) by mouth at bedtime as needed for sleep.   No facility-administered encounter medications on file as of 11/09/2023.  :   Review of Systems:  Out of a complete 14 point review of systems, all are reviewed and negative with the exception of these symptoms as listed below:  Review of Systems  Neurological:        Pt here for sleep consult Pt snores,fatigue,headaches,hypertension  Pt denies sleep study,cpap machine    ESS:14 FSS:45     Objective:  Neurological Exam  Physical Exam Physical Examination:   Vitals:   11/09/23 1433  BP: 124/72  Pulse: 63    General Examination: The patient is a very pleasant 43 y.o. male in no acute distress. He appears well-developed and well-nourished and well groomed.   HEENT: Normocephalic, atraumatic, pupils are equal, round and reactive to light, extraocular tracking is good without limitation to gaze excursion or nystagmus noted. Hearing is grossly intact. Face is symmetric with normal facial animation. Speech is clear with no dysarthria noted. There is no hypophonia. There is no lip, neck/head, jaw or voice tremor. Neck is supple with full range of passive and active motion. There are no carotid bruits on auscultation. Oropharynx exam reveals: Mild to moderate mouth dryness, adequate to marginal dental hygiene  with several teeth missing, moderate airway crowding secondary to small airway entry and larger uvula, tonsils about 1+ bilaterally.  Mallampati class II, neck circumference 15-3/8 inches, minimal overbite noted.  Tongue protrudes centrally and palate elevates symmetrically.   Chest: Clear to auscultation without wheezing, rhonchi or crackles noted.  Heart: S1+S2+0, regular and normal without murmurs, rubs or gallops noted.   Abdomen: Soft, non-tender and non-distended.  Extremities: There is no obvious swelling in the distal lower extremities bilaterally.   Skin: Warm and dry without trophic changes noted.   Musculoskeletal: exam reveals no obvious joint deformities.   Neurologically:  Mental status: The patient is awake, alert and oriented in all 4 spheres. His immediate and  remote memory, attention, language skills and fund of knowledge are appropriate. There is no evidence of aphasia, agnosia, apraxia or anomia. Speech is clear with normal prosody and enunciation. Thought process is linear. Mood is normal and affect is normal.  Cranial nerves II - XII are as described above under HEENT exam.  Motor exam: Normal bulk, moving all 4 extremities without restriction, no obvious action or resting tremor.  Fine motor skills and coordination: grossly intact.  Cerebellar testing: No dysmetria or intention tremor. There is no truncal or gait ataxia.  Sensory exam: intact to light touch in the upper and lower extremities.  Gait, station and balance: He stands easily. No veering to one side is noted. No leaning to one side is noted. Posture is age-appropriate and stance is narrow based. Gait shows normal stride length and normal pace. No problems turning are noted.   Assessment and Plan:  In summary, John Lozano is a very pleasant 43 y.o.-year old male with an underlying medical history of asthma, reflux disease, kidney stones, ankle fracture with status post ORIF to the right ankle in 2016,  history of opioid use disorder, previously on Suboxone  by prescription, currently on generic Suboxone  without prescription, anxiety, on Valium  and overweight state, whose history and physical exam are concerning for sleep disordered breathing, particularly obstructive sleep apnea (OSA). A laboratory attended sleep study is typically considered gold standard for evaluation of sleep disordered breathing.  Of note, given his high risk medication use including daily Valium  use and generic Suboxone  use, he is advised that he is at higher risk for central apneas as well and I explained the difference to him.   He is encouraged to talk to his behavioral health provider about his medication regimen and further decrease.  I had a long chat with the patient  about my findings and the diagnosis of sleep apnea, particularly OSA, its prognosis and treatment options. We talked about medical/conservative treatments, surgical interventions and non-pharmacological approaches for symptom control. I explained, in particular, the risks and ramifications of untreated moderate to severe OSA, especially with respect to developing cardiovascular disease down the road, including congestive heart failure (CHF), difficult to treat hypertension, cardiac arrhythmias (particularly A-fib), neurovascular complications including TIA, stroke and dementia. Even type 2 diabetes has, in part, been linked to untreated OSA. Symptoms of untreated OSA may include (but may not be limited to) daytime sleepiness, nocturia (i.e. frequent nighttime urination), memory problems, mood irritability and suboptimally controlled or worsening mood disorder such as depression and/or anxiety, lack of energy, lack of motivation, physical discomfort, as well as recurrent headaches, especially morning or nocturnal headaches. We talked about the importance of maintaining a healthy lifestyle and striving for healthy weight.   I recommended a sleep study at this time. I  outlined the differences between a laboratory attended sleep study which is considered more comprehensive and accurate over the option of a home sleep test (HST); the latter may lead to underestimation of sleep disordered breathing in some instances and does not help with diagnosing upper airway resistance syndrome and is not accurate enough to diagnose primary central sleep apnea typically. I outlined possible surgical and non-surgical treatment options of OSA, including the use of a positive airway pressure (PAP) device (i.e. CPAP, AutoPAP/APAP or BiPAP in certain circumstances), a custom-made dental device (aka oral appliance, which would require a referral to a specialist dentist or orthodontist typically, and is generally speaking not considered for patients with full dentures or edentulous state), upper  airway surgical options, such as traditional UPPP (which is not considered a first-line treatment) or the Inspire device (hypoglossal nerve stimulator, which would involve a referral for consultation with an ENT surgeon, after careful selection, following inclusion criteria - also not first-line treatment). I explained the PAP treatment option to the patient in detail, as this is generally considered first-line treatment.  The patient indicated that he would be willing to try PAP therapy, if the need arises.  We will pick up our discussion about the next steps and treatment options after testing.  We will keep him posted as to the test results by phone call and/or MyChart messaging where possible.  We will plan to follow-up in sleep clinic accordingly as well.  I answered all his questions today and the patient was in agreement.   I encouraged him to call with any interim questions, concerns, problems or updates or email us  through MyChart.  Generally speaking, sleep test authorizations may take up to 2 weeks, sometimes less, sometimes longer, the patient is encouraged to get in touch with us  if they do not  hear back from the sleep lab staff directly within the next 2 weeks.  Thank you very much for allowing me to participate in the care of this nice patient. If I can be of any further assistance to you please do not hesitate to call me at [phone].  Sincerely,   True Mar, MD, PhD

## 2023-11-09 NOTE — Progress Notes (Deleted)
 Subjective:    Patient ID: John Lozano is a 43 y.o. male.  HPI {Common ambulatory SmartLinks:[id number]}  Review of Systems  Objective:  Neurological Exam  Physical Exam  Assessment:   ***  Plan:   ***

## 2023-11-09 NOTE — Patient Instructions (Signed)

## 2023-11-10 ENCOUNTER — Telehealth: Payer: Self-pay | Admitting: Neurology

## 2023-11-10 NOTE — Telephone Encounter (Signed)
 NPSG Legrand pending

## 2023-11-21 NOTE — Telephone Encounter (Signed)
 NPSG Legrand barrows: [id number] (exp. 11/10/23 to 02/08/24)

## 2023-12-04 ENCOUNTER — Other Ambulatory Visit (HOSPITAL_COMMUNITY): Payer: Self-pay | Admitting: Psychiatry

## 2023-12-04 DIAGNOSIS — F411 Generalized anxiety disorder: Secondary | ICD-10-CM

## 2023-12-05 NOTE — Progress Notes (Signed)
 Psychiatric Follow Up Adult Assessment  Televisit via video: I connected with [name] on 11/5 at  2:30 PM EST by a video enabled telemedicine application and verified that I am speaking with the correct person using two identifiers.  Location: Patient: home Provider: office   I discussed the limitations of evaluation and management by telemedicine and the availability of in person appointments. The patient expressed understanding and agreed to proceed.  I discussed the assessment and treatment plan with the patient. The patient was provided an opportunity to ask questions and all were answered. The patient agreed with the plan and demonstrated an understanding of the instructions.   The patient was advised to call back or seek an in-person evaluation if the symptoms worsen or if the condition fails to improve as anticipated.   Patient Identification: TREAVON CASTILLEJA MRN:  [id number] Referral Source: Kayla Jeoffrey RAMAN, FNP  Assessment:  Patient presents for a follow-up appointment. In the prior appointment, it was discussed that  I will continue on his Valium  taper, will have him be on 4 mg for two more weeks and then decrease to 3 mg afterwards. Today, patient appears to be doing well and is tolerating the Valium  taper appropriately.  He did note self decreasing his Zoloft  from 100 mg to 75 mg then from 75-50 and he tolerated his self taper and feels that 50 is his optimal dose for Zoloft  to aid with his anxiety.  He has been coping with his psychosocial stressors appropriately.  Refill his Zoloft  50 mg and continue the Valium  taper from 3 mg to 2.5 mg for 1 week and if tolerating further decrease to 2 mg and hold until his next appointment.  Follow-up in 1 month.  Plan:  # Generalized anxiety disorder Past medication trials: clonazepam  (dependence), hydroxyzine  (ineffective) -- Decrease Zoloft  to 50 mg per pt request -- Continue trazodone  25-50 mg nightly as needed for  insomnia  # Benzodiazepine dependence Past medication trials: clonazepam  (rebound anxiety at lower doses) Interventions: -- Continue Valium  3 mg -> 2.5 mg for one week -> 2 mg  -PDMP reviewed today, filling appropriately  # Tobacco use # AUD - Continue encouraging decreasing use  Patient was given contact information for behavioral health clinic and was instructed to call 911 for emergencies.    Patient and plan of care will be discussed with the Attending MD, who agrees with the above statement and plan.   Identifying Information: STEPHENS SHREVE is a 43 y.o. male with a history of opiate use disorder, sedative use disorder, asthma, erectile dysfunction, chronic hepatitis C who presents in person to [facility] for medication management. Based on initial assessment, his symptoms are consistent with OUD, GAD, and Benzodiazepine dependence.    Subjective:  Chief Complaint: Medication Management  Interval History: Patient seen alone.  Patient reports feeling good today. Since the previous visit, he notes caring for the child has been fine. He notes work is good. He states the Zoloft  100 mg was a bit much regarding less clarity and focus so he decreased to 75 mg and then to 50 mg and he felt the decrease has been helpful with his mood and wants to maintain that dose. He notes tolerating the Valium  taper well, denying issues and he feels ready to further decrease to 2.5 mg for a week and then to 2 mg and hold the dose.   Patient denies current SI, HI, and AVH.   Past Psychiatric History:  Diagnoses: Benzodiazepine dependence, generalized anxiety disorder Medication trials: Clonazepam , trazodone . Has been on benzos for 4 years Previous psychiatrist/therapist: Previously saw Dr. 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 Relationship: married for 7 months Support: psychiatrist  Past Medical History:  Past Medical History:  Diagnosis Date   Asthma    GERD (gastroesophageal reflux disease) Not sure   Kidney stones    Open ankle fracture 11/13/2014    Past Surgical History:  Procedure Laterality Date   I & D EXTREMITY Right 11/13/2014   Procedure:  IRRIGATION AND DEBRIDEMENT RIGHT ANKLE, complex wound closure, placement of Vac, and splinting of right ankle ;  Surgeon: Donaciano Sprang, MD;  Location: [facility] OR;  Service: Orthopedics;  Laterality: Right;   ORIF ANKLE FRACTURE Right 11/23/2014   Procedure: OPEN REDUCTION INTERNAL FIXATION (ORIF) RIGHT  BIMALLEOLAR ANKLE FRACTURE;  Surgeon: Norleen Armor, MD;  Location: [facility] OR;  Service: Orthopedics;  Laterality: Right;   WISDOM TOOTH EXTRACTION       Social History   Socioeconomic History   Marital status: Single    Spouse name: Not on file   Number of children: Not on file   Years of education: Not on file   Highest education level: Not on file  Occupational History   Not on file  Tobacco Use   Smoking status: Every Day    Types: E-cigarettes   Smokeless tobacco: Never  Vaping Use   Vaping status: Never Used  Substance and Sexual Activity   Alcohol use: Not Currently    Alcohol/week: 4.0 standard drinks of alcohol    Types: 4 Cans of beer per week    Comment: occasional drink   Drug use: Not Currently    Types: Benzodiazepines    Comment: clonazepam    Sexual activity: Yes    Partners: Female    Birth control/protection: Condom    Comment: 1 monogamous  Other Topics Concern   Not on file  Social History Narrative   Pt lives life partner    Pt works     Social Drivers of Corporate Investment Banker Strain: Not on file  Food Insecurity: Not on file  Transportation Needs: Not on file  Physical Activity: Not on file  Stress: Not on file  Social Connections: Not on file    Allergies:   Allergies  Allergen Reactions   Shellfish Allergy Swelling   Cats Claw (Uncaria Tomentosa) Palpitations    Current Medications: Current Outpatient Medications  Medication Sig Dispense Refill   Buprenorphine  HCl-Naloxone HCl 8-2 MG FILM Place 6 mg of opioid under the tongue daily.     cetirizine (ZYRTEC) 10 MG tablet Take 10 mg by mouth daily.     diazepam  (VALIUM ) 2 MG tablet Take 2 tablets (4 mg total) by mouth daily at 2 PM for 14 days, THEN 1.5 tablets (3 mg total) daily at 2 PM for 25 days. 66 tablet 0   sertraline  (ZOLOFT ) 100 MG tablet TAKE 1 TABLET BY MOUTH EVERY DAY 90 tablet 1   traZODone  (DESYREL ) 50 MG tablet Take 0.5-1 tablets (25-50 mg total) by mouth at bedtime as needed  for sleep. 90 tablet 0   No current facility-administered medications for this visit.    Objective:  Psychiatric Specialty Exam: General Appearance: appears at stated age, casually dressed and groomed   Behavior: pleasant and cooperative   Psychomotor Activity: no psychomotor agitation or retardation noted   Eye Contact: fair  Speech: normal amount, volume and fluency    Mood: euthymic  Affect: congruent, pleasant and interactive   Thought Process: linear, goal directed, no circumstantial or tangential thought process noted, no racing thoughts or flight of ideas  Descriptions of Associations: intact   Thought Content Hallucinations: denies AH, VH , does not appear responding to stimuli  Delusions: no paranoia, delusions of control, grandeur, ideas of reference, thought broadcasting, and magical thinking  Suicidal Thoughts: denies SI, intention, plan  Homicidal Thoughts: denies HI, intention, plan   Alertness/Orientation: alert and fully oriented    Insight: fair Judgment: fair  Memory: intact   Executive Functions  Concentration: intact  Attention Span: fair  Recall: intact  Fund of Knowledge: fair   Physical Exam  General: Pleasant, well-appearing. No acute distress. Pulmonary: Normal effort. No wheezing or rales. Neuro: A&Ox3.No focal deficit.  Review of Systems  No reported symptoms  Screenings:  GAD-7    Flowsheet Row Office Visit from 01/24/2023 in [facility] [place] [facility] Office Visit from 01/03/2023 in [facility] [place] [facility] Office Visit from 12/16/2022 in [facility] [place] [facility] Office Visit from 08/19/2022 in [facility] [place] [facility] Office Visit from 05/17/2022 in [facility] [place] [facility]  Total GAD-7 Score 4 4 6  0 3   PHQ2-9    Flowsheet Row Office Visit from 01/24/2023 in [facility] [place] [facility] Office Visit from 01/03/2023 in [facility] [place] [facility] Office Visit from 12/16/2022 in [facility] [place] [facility] Office Visit from 08/19/2022 in [facility] [place] [facility] Office Visit from 05/17/2022 in [facility] [place] [facility]  PHQ-2 Total Score 0 0 2 0 0  PHQ-9 Total Score 5 2 5 5 5    Flowsheet Row UC from 09/11/2021 in [facility] RISK CATEGORY No Risk    Ismael Franco, MD PGY-3 Psychiatry Resident

## 2023-12-05 NOTE — Telephone Encounter (Signed)
 unable to lvm mailbox not set up

## 2023-12-14 ENCOUNTER — Telehealth (HOSPITAL_COMMUNITY): Payer: PRIVATE HEALTH INSURANCE | Admitting: Psychiatry

## 2023-12-14 DIAGNOSIS — F1320 Sedative, hypnotic or anxiolytic dependence, uncomplicated: Secondary | ICD-10-CM

## 2023-12-14 DIAGNOSIS — F411 Generalized anxiety disorder: Secondary | ICD-10-CM

## 2023-12-14 DIAGNOSIS — G4700 Insomnia, unspecified: Secondary | ICD-10-CM

## 2023-12-14 MED ORDER — TRAZODONE HCL 50 MG PO TABS: 25.0000 mg | ORAL_TABLET | Freq: Every evening | ORAL | 0 refills | Status: DC | PRN

## 2023-12-14 MED ORDER — SERTRALINE HCL 50 MG PO TABS: 50.0000 mg | ORAL_TABLET | Freq: Every day | ORAL | 0 refills | Status: DC

## 2023-12-14 MED ORDER — DIAZEPAM 2 MG PO TABS: 2.0000 mg | ORAL_TABLET | Freq: Every evening | ORAL | 0 refills | Status: DC

## 2023-12-14 NOTE — Addendum Note (Signed)
 Addended by: IZELLA CARAWAY B on: 12/14/2023 03:36 PM   Modules accepted: Level of Service

## 2023-12-21 NOTE — Telephone Encounter (Signed)
 Sent mychart

## 2023-12-26 NOTE — Telephone Encounter (Signed)
 I spoke with the patient.  He is scheduled at [facility] for 01/15/24 at 9 pm.  Mailed packet and sent mychart.  NPSG Le[place] barrows: [id number] (exp. 11/10/23 to 02/08/24)

## 2024-01-09 NOTE — Progress Notes (Unsigned)
 Psychiatric Follow Up Adult Assessment  Televisit via video: I connected with John Lozano on 11/5 at  2:00 PM EST by a video enabled telemedicine application and verified that I am speaking with the correct person using two identifiers.  Location: Patient: home Provider: office   I discussed the limitations of evaluation and management by telemedicine and the availability of in person appointments. The patient expressed understanding and agreed to proceed.  I discussed the assessment and treatment plan with the patient. The patient was provided an opportunity to ask questions and all were answered. The patient agreed with the plan and demonstrated an understanding of the instructions.   The patient was advised to call back or seek an in-person evaluation if the symptoms worsen or if the condition fails to improve as anticipated.   Patient Identification: John Lozano MRN:  [id number] Referral Source: Kayla Jeoffrey RAMAN, FNP  Assessment:  Patient presents for a follow-up appointment. In the prior appointment, the following changes were made: decreased Zoloft  to 50 (pt self tapered in the interim) and decreased Valium  to 2 mg. Today, ***   Plan:  # Generalized anxiety disorder Past medication trials: clonazepam  (dependence), hydroxyzine  (ineffective) -- Zoloft  50 mg per pt request -- Continue trazodone  25-50 mg nightly as needed for insomnia  # Benzodiazepine dependence Past medication trials: clonazepam  (rebound anxiety at lower doses) Interventions: -- Continue Valium  3 mg -> 2.5 mg for one week -> 2 mg  -PDMP reviewed today, filling appropriately  # Tobacco use # AUD - Continue encouraging decreasing use  Patient was given contact information for behavioral health clinic and was instructed to call 911 for emergencies.    Patient and plan of care will be discussed with the Attending MD, who agrees with the above statement and plan.   Identifying Information:  John Lozano is a 43 y.o. male with a history of opiate use disorder, sedative use disorder, asthma, erectile dysfunction, chronic hepatitis C who presents in person to [facility] for medication management. Based on initial assessment, his symptoms are consistent with OUD, GAD, and Benzodiazepine dependence.    Subjective:  Chief Complaint: Medication Management  Interval History: Patient seen ***.  Patient reports feeling *** today. Since the previous visit, ***. Stressors include ***.   Regarding psychiatric symptoms, ***. Patient reports the medications are ***. Patient reports the following adverse effects: ***.   Patient reports *** sleep, ***. Patient reports *** appetite, ***.   Patient denies current SI, HI, and AVH. ***  Substance use:  Tobacco: *** Alcohol: *** Illicit substances: ***   Past Psychiatric History:  Diagnoses: Benzodiazepine dependence, generalized anxiety disorder Medication trials: Clonazepam , trazodone . Has been on benzos for 4 years Previous psychiatrist/therapist: Previously saw Dr. 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 Relationship: married for 7 months Support: psychiatrist  Past Medical History:  Past Medical History:  Diagnosis Date   Asthma    GERD (gastroesophageal reflux disease) Not sure   Kidney stones    Open ankle fracture 11/13/2014    Past Surgical History:  Procedure Laterality Date   I & D EXTREMITY  Right 11/13/2014   Procedure:  IRRIGATION AND DEBRIDEMENT RIGHT  ANKLE, complex wound closure, placement of Vac, and splinting of right ankle ;  Surgeon: Donaciano Sprang, MD;  Location: [facility] OR;  Service: Orthopedics;  Laterality: Right;   ORIF ANKLE FRACTURE Right 11/23/2014   Procedure: OPEN REDUCTION INTERNAL FIXATION (ORIF) RIGHT  BIMALLEOLAR ANKLE FRACTURE;  Surgeon: Norleen Armor, MD;  Location: [facility] OR;  Service: Orthopedics;  Laterality: Right;   WISDOM TOOTH EXTRACTION       Social History   Socioeconomic History   Marital status: Single    Spouse name: Not on file   Number of children: Not on file   Years of education: Not on file   Highest education level: Not on file  Occupational History   Not on file  Tobacco Use   Smoking status: Every Day    Types: E-cigarettes   Smokeless tobacco: Never  Vaping Use   Vaping status: Never Used  Substance and Sexual Activity   Alcohol use: Not Currently    Alcohol/week: 4.0 standard drinks of alcohol    Types: 4 Cans of beer per week    Comment: occasional drink   Drug use: Not Currently    Types: Benzodiazepines    Comment: clonazepam    Sexual activity: Yes    Partners: Female    Birth control/protection: Condom    Comment: 1 monogamous  Other Topics Concern   Not on file  Social History Narrative   Pt lives life partner    Pt works    Social Drivers of Corporate Investment Banker Strain: Not on file  Food Insecurity: Not on file  Transportation Needs: Not on file  Physical Activity: Not on file  Stress: Not on file  Social Connections: Not on file    Allergies:   Allergies  Allergen Reactions   Shellfish Allergy Swelling   Cats Claw (Uncaria Tomentosa) Palpitations    Current Medications: Current Outpatient Medications  Medication Sig Dispense Refill   cetirizine (ZYRTEC) 10 MG tablet Take 10 mg by mouth daily.     diazepam  (VALIUM ) 2 MG tablet Take 1-1.5 tablets (2-3 mg total) by mouth every evening. Plan for continuing 3 mg until 11/9 -> decrease to 2.5 for one week ->  decrease to 2 mg and we will hold dose 45 tablet 0   sertraline  (ZOLOFT ) 50 MG tablet Take 1 tablet (50 mg total) by mouth daily. 60 tablet 0   traZODone  (DESYREL ) 50 MG tablet Take 0.5-1 tablets (25-50 mg total) by mouth at bedtime as needed for sleep. 90 tablet 0   No current facility-administered medications for this visit.    Objective: Psychiatric Specialty Exam: General Appearance: appears at stated age, casually dressed and groomed ***  Behavior: pleasant and cooperative ***  Psychomotor Activity: no psychomotor agitation or retardation noted ***  Eye Contact: fair *** Speech: normal amount, volume and fluency ***   Mood: euthymic *** Affect: congruent, pleasant and interactive ***  Thought Process: linear, goal directed, no circumstantial or tangential thought process noted, no racing thoughts or flight of ideas *** Descriptions of Associations: intact ***  Thought Content Hallucinations: denies AH, VH , does not appear responding to stimuli *** Delusions: no paranoia, delusions of control, grandeur, ideas of reference, thought broadcasting, and magical thinking *** Suicidal Thoughts: denies SI, intention, plan *** Homicidal Thoughts: denies HI, intention, plan ***  Alertness/Orientation: alert and fully oriented ***  Insight: fair*** Judgment: fair***  Memory: intact ***  Art Therapist  Concentration: intact *** Attention Span: fair *** Recall: intact *** Fund of Knowledge: fair ***  Physical Exam *** General: Pleasant, well-appearing. No acute distress. Pulmonary: Normal effort. No wheezing or rales. Skin: No obvious rash or lesions.*** Neuro: A&Ox3.No focal deficit.  Review of Systems *** No reported symptoms  Screenings:  GAD-7    Flowsheet Row Office Visit from 01/24/2023 in [facility] [place] [facility] Office Visit from 01/03/2023 in [facility] [place] [facility] Office Visit from 12/16/2022 in [facility] [place]  Family Medicine Office Visit from 08/19/2022 in [facility] [place] [facility] Office Visit from 05/17/2022 in [facility] [place] [facility]  Total GAD-7 Score 4 4 6  0 3   PHQ2-9    Flowsheet Row Office Visit from 01/24/2023 in [facility] [place] [facility] Office Visit from 01/03/2023 in [facility] [address] [facility] Office Visit from 12/16/2022 in [facility] [place] [facility] Office Visit from 08/19/2022 in [facility] [place] [facility] Office Visit from 05/17/2022 in [facility] [place] [facility]  PHQ-2 Total Score 0 0 2 0 0  PHQ-9 Total Score 5 2 5 5 5    Flowsheet Row UC from 09/11/2021 in [facility] RISK CATEGORY No Risk    Ismael Franco, MD PGY-3 Psychiatry Resident

## 2024-01-15 ENCOUNTER — Ambulatory Visit: Admitting: Neurology

## 2024-01-15 DIAGNOSIS — G4719 Other hypersomnia: Secondary | ICD-10-CM

## 2024-01-15 DIAGNOSIS — F1190 Opioid use, unspecified, uncomplicated: Secondary | ICD-10-CM

## 2024-01-15 DIAGNOSIS — R0683 Snoring: Secondary | ICD-10-CM

## 2024-01-15 DIAGNOSIS — Z9189 Other specified personal risk factors, not elsewhere classified: Secondary | ICD-10-CM

## 2024-01-15 DIAGNOSIS — Z79899 Other long term (current) drug therapy: Secondary | ICD-10-CM

## 2024-01-15 DIAGNOSIS — R0681 Apnea, not elsewhere classified: Secondary | ICD-10-CM

## 2024-01-18 ENCOUNTER — Telehealth (HOSPITAL_BASED_OUTPATIENT_CLINIC_OR_DEPARTMENT_OTHER): Payer: PRIVATE HEALTH INSURANCE | Admitting: Psychiatry

## 2024-01-18 DIAGNOSIS — F411 Generalized anxiety disorder: Secondary | ICD-10-CM

## 2024-01-18 DIAGNOSIS — G4700 Insomnia, unspecified: Secondary | ICD-10-CM

## 2024-01-18 MED ORDER — TRAZODONE HCL 50 MG PO TABS: 25.0000 mg | ORAL_TABLET | Freq: Every evening | ORAL | 0 refills | Status: AC | PRN

## 2024-01-18 MED ORDER — SERTRALINE HCL 50 MG PO TABS: 50.0000 mg | ORAL_TABLET | Freq: Every day | ORAL | 0 refills | Status: AC

## 2024-01-18 MED ORDER — DIAZEPAM 2 MG PO TABS: 1.0000 mg | ORAL_TABLET | Freq: Every evening | ORAL | 0 refills | Status: AC

## 2024-01-18 NOTE — Addendum Note (Signed)
 Addended by: CARVIN CROCK on: 01/18/2024 04:52 PM   Modules accepted: Level of Service

## 2024-01-23 ENCOUNTER — Ambulatory Visit: Payer: Self-pay | Admitting: Neurology

## 2024-01-23 NOTE — Procedures (Signed)
 Physician Interpretation: Please see link under Procedure Tab or under Encounters tab for physician report, technical report, as well as O2 titration and/or PAP titration tables (if applicable).

## 2024-01-29 ENCOUNTER — Encounter

## 2024-03-12 NOTE — Progress Notes (Unsigned)
 " Psychiatric Follow Up Adult Assessment  Televisit via video: I connected with John Lozano on 2/16 at  2:00 PM EST by a video enabled telemedicine application and verified that I am speaking with the correct person using two identifiers.  Location: Patient: work [employer]: work   I discussed the limitations of evaluation and management by telemedicine and the availability of in person appointments. The patient expressed understanding and agreed to proceed.  I discussed the assessment and treatment plan with the patient. The patient was provided an opportunity to ask questions and all were answered. The patient agreed with the plan and demonstrated an understanding of the instructions.   The patient was advised to call back or seek an in-person evaluation if the symptoms worsen or if the condition fails to improve as anticipated.   Patient Identification: John Lozano MRN:  [id number] Referral Source: Kayla Jeoffrey RAMAN, FNP  Assessment:  Patient presents for a follow-up appointment. In the prior appointment, we continued the Valium  taper. Today, ***   Plan:  # Generalized anxiety disorder Past medication trials: clonazepam  (dependence), hydroxyzine  (ineffective) -- Continue Zoloft  50 mg  -- Continue trazodone  25-50 mg nightly as needed for insomnia  # Benzodiazepine dependence Past medication trials: clonazepam  (rebound anxiety at lower doses) Interventions: -- Decrease Valium  to 1.5 mg in one week -> decrease to 1 mg after 2 weeks -> decrease to 0.5 mg for 2 weeks -> stop  -PDMP reviewed today, filling appropriately  # Tobacco use # AUD - Continue encouraging decreasing use  Patient was given contact information for behavioral health clinic and was instructed to call 911 for emergencies.    Patient and plan of care will be discussed with the Attending MD, who agrees with the above statement and plan.   Identifying Information: John Lozano is a 44 y.o.  male with a history of opiate use disorder, sedative use disorder, asthma, erectile dysfunction, chronic hepatitis C who presents in person to [facility] for medication management. Based on initial assessment, his symptoms are consistent with OUD, GAD, and Benzodiazepine dependence.    Subjective:  Chief Complaint: Medication Management  Interval History: Patient seen ***.  Patient reports feeling *** today. Regarding psychiatric symptoms, ***. Patient reports the medications are ***. Patient reports the following adverse effects: ***.  Patient reports *** sleep, ***. Patient reports *** appetite, ***.   Since the previous visit, ***. Stressors include ***.   Patient denies current SI, HI, and AVH. ***   Substance use:  Tobacco: yes vaping daily, planning to use Zyn Alcohol: occasionally (once a week) having a few beers Illicit substances: denies   Past Psychiatric History:  Diagnoses: Benzodiazepine dependence, generalized anxiety disorder Medication trials: Clonazepam , trazodone . Has been on benzos for 4 years Previous psychiatrist/therapist: Previously saw Dr. 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 Relationship: married for 7 months Support: psychiatrist  Past Medical History:  Past Medical History:  Diagnosis Date   Asthma    GERD (gastroesophageal reflux disease) Not sure   Kidney stones    Open ankle fracture 11/13/2014    Past Surgical History:  Procedure Laterality Date   I & D EXTREMITY Right 11/13/2014    Procedure:  IRRIGATION AND DEBRIDEMENT RIGHT ANKLE, complex wound closure, placement of Vac, and splinting of right ankle ;  Surgeon: Donaciano Sprang, MD;  Location: [facility] OR;  Service: Orthopedics;  Laterality: Right;   ORIF ANKLE FRACTURE Right 11/23/2014   Procedure: OPEN REDUCTION INTERNAL FIXATION (ORIF) RIGHT  BIMALLEOLAR ANKLE FRACTURE;  Surgeon: Norleen Armor, MD;  Location: [facility] OR;  Service: Orthopedics;  Laterality: Right;   WISDOM TOOTH EXTRACTION       Social History   Socioeconomic History   Marital status: Single    Spouse name: Not on file   Number of children: Not on file   Years of education: Not on file   Highest education level: Not on file  Occupational History   Not on file  Tobacco Use   Smoking status: Every Day    Types: E-cigarettes   Smokeless tobacco: Never  Vaping Use   Vaping status: Never Used  Substance and Sexual Activity   Alcohol use: Not Currently    Alcohol/week: 4.0 standard drinks of alcohol    Types: 4 Cans of beer per week    Comment: occasional drink   Drug use: Not Currently    Types: Benzodiazepines    Comment: clonazepam    Sexual activity: Yes    Partners: Female    Birth control/protection: Condom    Comment: 1 monogamous  Other Topics Concern   Not on file  Social History Narrative   Pt lives life partner    Pt works    Social Drivers of Health   Tobacco Use: High Risk (11/09/2023)   Patient History    Smoking Tobacco Use: Every Day    Smokeless Tobacco Use: Never    Passive Exposure: Not on file  Financial Resource Strain: Not on file  Food Insecurity: Not on file  Transportation Needs: Not on file  Physical Activity: Not on file  Stress: Not on file  Social Connections: Not on file  Depression (EYV7-0): Medium Risk (01/24/2023)   Depression (PHQ2-9)    PHQ-2 Score: 5  Alcohol Screen: Not on file  Housing: Not on file  Utilities: Not on file  Health Literacy: Not on file    Allergies:   Allergies  Allergen  Reactions   Shellfish Allergy Swelling   Cats Claw (Uncaria Tomentosa) Palpitations    Current Medications: Current Outpatient Medications  Medication Sig Dispense Refill   cetirizine (ZYRTEC) 10 MG tablet Take 10 mg by mouth daily.     diazepam  (VALIUM ) 2 MG tablet Take 0.5-1 tablets (1-2 mg total) by mouth every evening. Decrease to 1.5 mg on 12/17-> Decrease to 1 mg on 12/31 ->  Decrease to 0.5 mg on 1/14 -> stop on 1/28 30 tablet 0   sertraline  (ZOLOFT ) 50 MG tablet Take 1 tablet (50 mg total) by mouth daily. 90 tablet 0   traZODone  (DESYREL ) 50 MG tablet Take 0.5-1 tablets (25-50 mg total) by mouth at bedtime as needed for sleep. 90 tablet 0   No current facility-administered medications for this visit.    Objective: *** Psychiatric Specialty Exam: General Appearance: appears at stated age, casually dressed and groomed   Behavior: pleasant and cooperative   Psychomotor Activity: no psychomotor agitation or retardation noted   Eye Contact: fair  Speech: normal amount, volume and fluency    Mood: euthymic  Affect: congruent, pleasant and interactive   Thought Process: linear, goal directed, no circumstantial or  tangential thought process noted, no racing thoughts or flight of ideas  Descriptions of Associations: intact   Thought Content Hallucinations: denies AH, VH , does not appear responding to stimuli  Delusions: no paranoia, delusions of control, grandeur, ideas of reference, thought broadcasting, and magical thinking  Suicidal Thoughts: denies SI, intention, plan  Homicidal Thoughts: denies HI, intention, plan   Alertness/Orientation: alert and fully oriented   Insight: fair Judgment: fair  Memory: intact   Executive Functions  Concentration: intact  Attention Span: fair  Recall: intact  Fund of Knowledge: fair   Physical Exam  General: Pleasant, well-appearing. No acute distress. Pulmonary: Normal effort. No wheezing or rales. Neuro: A&Ox3.No focal  deficit.  Review of Systems  No reported symptoms  Screenings:  GAD-7    Flowsheet Row Office Visit from 01/24/2023 in [facility] Office Visit from 01/03/2023 in [facility] Office Visit from 12/16/2022 in [facility] Office Visit from 08/19/2022 in [facility] Office Visit from 05/17/2022 in [facility]  Total GAD-7 Score 4 4 6  0 3   PHQ2-9    Flowsheet Row Office Visit from 01/24/2023 in [facility] Office Visit from 01/03/2023 in [facility] Office Visit from 12/16/2022 in [facility] Office Visit from 08[facility]19/2022 in [facility] Office Visit from 05/17/2022 in [facility]  PHQ-2 Total Score 0 0 2 0 0  PHQ-9 Total Score 5 2 5 5 5    Flowsheet Row UC from 09/11/2021 in [facility] RISK CATEGORY No Risk    Ismael Franco, MD PGY-3 Psychiatry Resident   "

## 2024-03-26 ENCOUNTER — Telehealth (HOSPITAL_COMMUNITY): Admitting: Psychiatry

## 2024-09-27 ENCOUNTER — Other Ambulatory Visit

## 2024-10-01 ENCOUNTER — Encounter: Admitting: Family Medicine
